# Patient Record
Sex: Male | Born: 1975 | Race: Black or African American | Hispanic: No | Marital: Single | Smoking: Current every day smoker
Health system: Southern US, Community
[De-identification: ages and names within clinical notes are randomized; demographics above are authoritative.]

## PROBLEM LIST (undated history)

## (undated) DIAGNOSIS — E119 Type 2 diabetes mellitus without complications: Secondary | ICD-10-CM

## (undated) DIAGNOSIS — F1910 Other psychoactive substance abuse, uncomplicated: Secondary | ICD-10-CM

## (undated) HISTORY — PX: BACK SURGERY: SHX140

## (undated) HISTORY — PX: OTHER SURGICAL HISTORY: SHX169

---

## 2004-09-26 ENCOUNTER — Emergency Department: Payer: Self-pay | Admitting: Emergency Medicine

## 2005-10-10 ENCOUNTER — Inpatient Hospital Stay: Payer: Self-pay | Admitting: General Surgery

## 2006-02-11 ENCOUNTER — Emergency Department: Payer: Self-pay | Admitting: Emergency Medicine

## 2006-06-14 ENCOUNTER — Emergency Department: Payer: Self-pay | Admitting: Emergency Medicine

## 2006-06-14 ENCOUNTER — Other Ambulatory Visit: Payer: Self-pay

## 2015-09-07 ENCOUNTER — Emergency Department
Admission: EM | Admit: 2015-09-07 | Discharge: 2015-09-07 | Disposition: A | Discharge status: Home / Self Care | Attending: Emergency Medicine | Admitting: Emergency Medicine

## 2015-09-07 ENCOUNTER — Emergency Department

## 2015-09-07 ENCOUNTER — Encounter: Payer: Self-pay | Admitting: Emergency Medicine

## 2015-09-07 DIAGNOSIS — S0231XA Fracture of orbital floor, right side, initial encounter for closed fracture: Secondary | ICD-10-CM | POA: Insufficient documentation

## 2015-09-07 DIAGNOSIS — S02839A Fracture of medial orbital wall, unspecified side, initial encounter for closed fracture: Secondary | ICD-10-CM

## 2015-09-07 DIAGNOSIS — S0990XA Unspecified injury of head, initial encounter: Secondary | ICD-10-CM | POA: Insufficient documentation

## 2015-09-07 DIAGNOSIS — F17210 Nicotine dependence, cigarettes, uncomplicated: Secondary | ICD-10-CM | POA: Insufficient documentation

## 2015-09-07 DIAGNOSIS — Y92148 Other place in prison as the place of occurrence of the external cause: Secondary | ICD-10-CM | POA: Insufficient documentation

## 2015-09-07 DIAGNOSIS — S40011A Contusion of right shoulder, initial encounter: Secondary | ICD-10-CM | POA: Insufficient documentation

## 2015-09-07 DIAGNOSIS — M7918 Myalgia, other site: Secondary | ICD-10-CM

## 2015-09-07 DIAGNOSIS — Y998 Other external cause status: Secondary | ICD-10-CM | POA: Insufficient documentation

## 2015-09-07 DIAGNOSIS — Y9389 Activity, other specified: Secondary | ICD-10-CM | POA: Insufficient documentation

## 2015-09-07 DIAGNOSIS — S022XXA Fracture of nasal bones, initial encounter for closed fracture: Secondary | ICD-10-CM | POA: Insufficient documentation

## 2015-09-07 DIAGNOSIS — S0083XA Contusion of other part of head, initial encounter: Secondary | ICD-10-CM | POA: Insufficient documentation

## 2015-09-07 MED ORDER — LORAZEPAM 2 MG/ML IJ SOLN
1.0000 mg | Freq: Once | INTRAMUSCULAR | Status: AC
  Administered 2015-09-07: 1 mg via INTRAMUSCULAR
  Filled 2015-09-07: qty 1

## 2015-09-07 MED ORDER — TRAMADOL HCL 50 MG PO TABS
50.0000 mg | ORAL_TABLET | Freq: Once | ORAL | Status: AC
  Administered 2015-09-07: 50 mg via ORAL
  Filled 2015-09-07: qty 1

## 2015-09-07 MED ORDER — AMOXICILLIN-POT CLAVULANATE 875-125 MG PO TABS: 1.0000 | ORAL_TABLET | Freq: Two times a day (BID) | ORAL | Status: AC

## 2015-09-07 MED ORDER — TRAMADOL HCL 50 MG PO TABS: 50.0000 mg | ORAL_TABLET | Freq: Four times a day (QID) | ORAL | Status: DC | PRN
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
## 2015-09-07 NOTE — Discharge Instructions (Signed)
Contusion A contusion is a deep bruise. Contusions are the result of a blunt injury to tissues and muscle fibers under the skin. The injury causes bleeding under the skin. The skin overlying the contusion may turn blue, purple, or yellow. Minor injuries will give you a painless contusion, but more severe contusions may stay painful and swollen for a few weeks.  CAUSES  This condition is usually caused by a blow, trauma, or direct force to an area of the body. SYMPTOMS  Symptoms of this condition include:  Swelling of the injured area.  Pain and tenderness in the injured area.  Discoloration. The area may have redness and then turn blue, purple, or yellow. DIAGNOSIS  This condition is diagnosed based on a physical exam and medical history. An X-ray, CT scan, or MRI may be needed to determine if there are any associated injuries, such as broken bones (fractures). TREATMENT  Specific treatment for this condition depends on what area of the body was injured. In general, the best treatment for a contusion is resting, icing, applying pressure to (compression), and elevating the injured area. This is often called the RICE strategy. Over-the-counter anti-inflammatory medicines may also be recommended for pain control.  HOME CARE INSTRUCTIONS   Rest the injured area.  If directed, apply ice to the injured area:  Put ice in a plastic bag.  Place a towel between your skin and the bag.  Leave the ice on for 20 minutes, 2-3 times per day.  If directed, apply light compression to the injured area using an elastic bandage. Make sure the bandage is not wrapped too tightly. Remove and reapply the bandage as directed by your health care provider.  If possible, raise (elevate) the injured area above the level of your heart while you are sitting or lying down.  Take over-the-counter and prescription medicines only as told by your health care provider. SEEK MEDICAL CARE IF:  Your symptoms do not  improve after several days of treatment.  Your symptoms get worse.  You have difficulty moving the injured area. SEEK IMMEDIATE MEDICAL CARE IF:   You have severe pain.  You have numbness in a hand or foot.  Your hand or foot turns pale or cold.   This information is not intended to replace advice given to you by your health care provider. Make sure you discuss any questions you have with your health care provider.   Document Released: 05/30/2005 Document Revised: 05/11/2015 Document Reviewed: 01/05/2015 Elsevier Interactive Patient Education 2016 [employer].  General Assault Assault includes any behavior or physical attack--whether it is on purpose or not--that results in injury to another person, damage to property, or both. This also includes assault that has not yet happened, but is planned to happen. Threats of assault may be physical, verbal, or written. They may be said or sent by:  Mail.  E-mail.  Text.  Social media.  Fax. The threats may be direct, implied, or understood. WHAT ARE THE DIFFERENT FORMS OF ASSAULT? Forms of assault include:  Physically assaulting a person. This includes physical threats to inflict physical harm as well as:  Slapping.  Hitting.  Poking.  Kicking.  Punching.  Pushing.  Sexually assaulting a person. Sexual assault is any sexual activity that a person is forced, threatened, or coerced to participate in. It may or may not involve physical contact with the person who is assaulting you. You are sexually assaulted if you are forced to have sexual contact of any kind.  Damaging or destroying a person's assistive equipment, such as glasses, canes, or walkers.  Throwing or hitting objects.  Using or displaying a weapon to harm or threaten someone.  Using or displaying an object that appears to be a weapon in a threatening manner.  Using greater physical size or strength to intimidate someone.  Making intimidating or  threatening gestures.  Bullying.  Hazing.  Using language that is intimidating, threatening, hostile, or abusive.  Stalking.  Restraining someone with force. WHAT SHOULD I DO IF I EXPERIENCE ASSAULT?  Report assaults, threats, and stalking to the police. Call your local emergency services (911 in the [place] if you are in immediate danger or you need medical help.  You can work with a Clinical research associate or an advocate to get legal protection against someone who has assaulted you or threatened you with assault. Protection includes restraining orders and private addresses. Crimes against you, such as assault, can also be prosecuted through the courts. Laws will vary depending on where you live.   This information is not intended to replace advice given to you by your health care provider. Make sure you discuss any questions you have with your health care provider.   Document Released: 08/20/2005 Document Revised: 09/10/2014 Document Reviewed: 05/07/2014 Elsevier Interactive Patient Education 2016 Elsevier Inc.  Musculoskeletal Pain Musculoskeletal pain is muscle and boney aches and pains. These pains can occur in any part of the body. Your caregiver may treat you without knowing the cause of the pain. They may treat you if blood or urine tests, X-rays, and other tests were normal.  CAUSES There is often not a definite cause or reason for these pains. These pains may be caused by a type of germ (virus). The discomfort may also come from overuse. Overuse includes working out too hard when your body is not fit. Boney aches also come from weather changes. Bone is sensitive to atmospheric pressure changes. HOME CARE INSTRUCTIONS   Ask when your test results will be ready. Make sure you get your test results.  Only take over-the-counter or prescription medicines for pain, discomfort, or fever as directed by your caregiver. If you were given medications for your condition, do not drive, operate machinery or  power tools, or sign legal documents for 24 hours. Do not drink alcohol. Do not take sleeping pills or other medications that may interfere with treatment.  Continue all activities unless the activities cause more pain. When the pain lessens, slowly resume normal activities. Gradually increase the intensity and duration of the activities or exercise.  During periods of severe pain, bed rest may be helpful. Lay or sit in any position that is comfortable.  Putting ice on the injured area.  Put ice in a bag.  Place a towel between your skin and the bag.  Leave the ice on for 15 to 20 minutes, 3 to 4 times a day.  Follow up with your caregiver for continued problems and no reason can be found for the pain. If the pain becomes worse or does not go away, it may be necessary to repeat tests or do additional testing. Your caregiver may need to look further for a possible cause. SEEK IMMEDIATE MEDICAL CARE IF:  You have pain that is getting worse and is not relieved by medications.  You develop chest pain that is associated with shortness or breath, sweating, feeling sick to your stomach (nauseous), or throw up (vomit).  Your pain becomes localized to the abdomen.  You develop any new  symptoms that seem different or that concern you. MAKE SURE YOU:   Understand these instructions.  Will watch your condition.  Will get help right away if you are not doing well or get worse.   This information is not intended to replace advice given to you by your health care provider. Make sure you discuss any questions you have with your health care provider.   Document Released: 08/20/2005 Document Revised: 11/12/2011 Document Reviewed: 04/24/2013 Elsevier Interactive Patient Education 2016 Elsevier Inc.  Nasal Fracture A nasal fracture is a break or crack in the bones or cartilage of the nose. Minor breaks do not require treatment. These breaks usually heal on their own after about one month. Serious  breaks may require surgery. CAUSES This injury is usually caused by a blunt injury to the nose. This type of injury often occurs from:  Contact sports.  Car accidents.  Falls.  Getting punched. SYMPTOMS Symptoms of this injury include:  Pain.  Swelling of the nose.  Bleeding from the nose.  Bruising around the nose or eyes. This may include having black eyes.  Crooked appearance of the nose. DIAGNOSIS This injury may be diagnosed with a physical exam. The health care provider will gently feel the nose for signs of broken bones. He or she will look inside the nostrils to make sure that there is not a blood-filled swelling on the dividing wall between the nostrils (septal hematoma). X-rays of the nose may not show a nasal fracture even when one is present. In some cases, X-rays or a CT scan may be done 1-5 days after the injury. Sometimes, the health care provider will want to wait until the swelling has gone down. TREATMENT Often, minor fractures that have caused no deformity do not require treatment. More serious fractures in which bones have moved out of position may require surgery, which will take place after the swelling is gone. Surgery will stabilize and align the fracture. In some cases, a health care provider may be able to reposition the bones without surgery. This may be done in the health care provider's office after medicine is given to numb the area (local anesthetic). HOME CARE INSTRUCTIONS  If directed, apply ice to the injured area:  Put ice in a plastic bag.  Place a towel between your skin and the bag.  Leave the ice on for 20 minutes, 2-3 times per day.  Take over-the-counter and prescription medicines only as told by your health care provider.  If your nose starts to bleed, sit in an upright position while you squeeze the soft parts of your nose against the dividing wall between your nostrils (septum) for 10 minutes.  Try to avoid blowing your  nose.  Return to your normal activities as told by your health care provider. Ask your health care provider what activities are safe for you.  Avoid contact sports for 3-4 weeks or as told by your health care provider.  Keep all follow-up visits as told by your health care provider. This is important. SEEK MEDICAL CARE IF:  Your pain increases or becomes severe.  You continue to have nosebleeds.  The shape of your nose does not return to normal within 5 days.  You have pus draining out of your nose. SEEK IMMEDIATE MEDICAL CARE IF:  You have bleeding from your nose that does not stop after you pinch your nostrils closed for 20 minutes and keep ice on your nose.  You have clear fluid draining out of your  nose.  You notice a grape-like swelling on the septum. This swelling is a collection of blood (hematoma) that must be drained to help prevent infection.  You have difficulty moving your eyes.  You have repeated vomiting.   This information is not intended to replace advice given to you by your health care provider. Make sure you discuss any questions you have with your health care provider.   Document Released: 08/17/2000 Document Revised: 05/11/2015 Document Reviewed: 09/27/2014 Elsevier Interactive Patient Education [employer] Elsevier Inc.

## 2015-09-07 NOTE — ED Notes (Signed)
Pt peed himself. Bed linen was changed.  
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
## 2016-04-09 ENCOUNTER — Inpatient Hospital Stay
Admission: EM | Admit: 2016-04-09 | Discharge: 2016-04-11 | DRG: 897 | Disposition: A | Discharge status: Home / Self Care | Payer: Self-pay | Attending: Internal Medicine | Admitting: Internal Medicine

## 2016-04-09 ENCOUNTER — Emergency Department: Payer: Self-pay

## 2016-04-09 ENCOUNTER — Encounter: Payer: Self-pay | Admitting: Emergency Medicine

## 2016-04-09 DIAGNOSIS — I951 Orthostatic hypotension: Secondary | ICD-10-CM | POA: Diagnosis present

## 2016-04-09 DIAGNOSIS — F1910 Other psychoactive substance abuse, uncomplicated: Secondary | ICD-10-CM

## 2016-04-09 DIAGNOSIS — D72829 Elevated white blood cell count, unspecified: Secondary | ICD-10-CM | POA: Diagnosis present

## 2016-04-09 DIAGNOSIS — R4182 Altered mental status, unspecified: Secondary | ICD-10-CM

## 2016-04-09 DIAGNOSIS — Z716 Tobacco abuse counseling: Secondary | ICD-10-CM

## 2016-04-09 DIAGNOSIS — F19121 Other psychoactive substance abuse with intoxication delirium: Principal | ICD-10-CM | POA: Diagnosis present

## 2016-04-09 DIAGNOSIS — E876 Hypokalemia: Secondary | ICD-10-CM | POA: Diagnosis present

## 2016-04-09 DIAGNOSIS — I248 Other forms of acute ischemic heart disease: Secondary | ICD-10-CM | POA: Diagnosis present

## 2016-04-09 DIAGNOSIS — M6282 Rhabdomyolysis: Secondary | ICD-10-CM | POA: Diagnosis present

## 2016-04-09 DIAGNOSIS — R778 Other specified abnormalities of plasma proteins: Secondary | ICD-10-CM

## 2016-04-09 DIAGNOSIS — F1210 Cannabis abuse, uncomplicated: Secondary | ICD-10-CM

## 2016-04-09 DIAGNOSIS — E871 Hypo-osmolality and hyponatremia: Secondary | ICD-10-CM | POA: Diagnosis present

## 2016-04-09 DIAGNOSIS — J441 Chronic obstructive pulmonary disease with (acute) exacerbation: Secondary | ICD-10-CM | POA: Diagnosis present

## 2016-04-09 DIAGNOSIS — R7989 Other specified abnormal findings of blood chemistry: Secondary | ICD-10-CM

## 2016-04-09 DIAGNOSIS — R109 Unspecified abdominal pain: Secondary | ICD-10-CM

## 2016-04-09 DIAGNOSIS — F12121 Cannabis abuse with intoxication delirium: Secondary | ICD-10-CM | POA: Diagnosis present

## 2016-04-09 DIAGNOSIS — F10188 Alcohol abuse with other alcohol-induced disorder: Secondary | ICD-10-CM | POA: Diagnosis present

## 2016-04-09 DIAGNOSIS — E860 Dehydration: Secondary | ICD-10-CM | POA: Diagnosis present

## 2016-04-09 DIAGNOSIS — F19921 Other psychoactive substance use, unspecified with intoxication with delirium: Secondary | ICD-10-CM

## 2016-04-09 DIAGNOSIS — G255 Other chorea: Secondary | ICD-10-CM

## 2016-04-09 DIAGNOSIS — F17210 Nicotine dependence, cigarettes, uncomplicated: Secondary | ICD-10-CM | POA: Diagnosis present

## 2016-04-09 DIAGNOSIS — F1410 Cocaine abuse, uncomplicated: Secondary | ICD-10-CM | POA: Diagnosis present

## 2016-04-09 DIAGNOSIS — R000 Tachycardia, unspecified: Secondary | ICD-10-CM

## 2016-04-09 HISTORY — DX: Other psychoactive substance abuse, uncomplicated: F19.10

## 2016-04-09 LAB — CBC
HCT: 45.3 % (ref 40.0–52.0)
HEMOGLOBIN: 15.6 g/dL (ref 13.0–18.0)
MCH: 30 pg (ref 26.0–34.0)
MCHC: 34.3 g/dL (ref 32.0–36.0)
MCV: 87.4 fL (ref 80.0–100.0)
PLATELETS: 200 10*3/uL (ref 150–440)
RBC: 5.19 MIL/uL (ref 4.40–5.90)
RDW: 13.3 % (ref 11.5–14.5)
WBC: 24.9 10*3/uL — AB (ref 3.8–10.6)

## 2016-04-09 LAB — COMPREHENSIVE METABOLIC PANEL
ALBUMIN: 3.6 g/dL (ref 3.5–5.0)
ALK PHOS: 101 U/L (ref 38–126)
ALT: 145 U/L — ABNORMAL HIGH (ref 17–63)
ALT: 121 U/L — ABNORMAL HIGH (ref 17–63)
ANION GAP: 15 (ref 5–15)
ANION GAP: 14 (ref 5–15)
AST: 369 U/L — ABNORMAL HIGH (ref 15–41)
AST: 241 U/L — ABNORMAL HIGH (ref 15–41)
Albumin: 4.4 g/dL (ref 3.5–5.0)
Alkaline Phosphatase: 80 U/L (ref 38–126)
BILIRUBIN TOTAL: 1.7 mg/dL — AB (ref 0.3–1.2)
BUN: 20 mg/dL (ref 6–20)
BUN: 19 mg/dL (ref 6–20)
CALCIUM: 9.7 mg/dL (ref 8.9–10.3)
CHLORIDE: 101 mmol/L (ref 101–111)
CO2: 23 mmol/L (ref 22–32)
CO2: 21 mmol/L — ABNORMAL LOW (ref 22–32)
Calcium: 8.6 mg/dL — ABNORMAL LOW (ref 8.9–10.3)
Chloride: 95 mmol/L — ABNORMAL LOW (ref 101–111)
Creatinine, Ser: 1.39 mg/dL — ABNORMAL HIGH (ref 0.61–1.24)
Creatinine, Ser: 1.13 mg/dL (ref 0.61–1.24)
GFR calc Af Amer: >60 mL/min (ref >60)
GFR calc non Af Amer: >60 mL/min (ref >60)
GFR calc non Af Amer: >60 mL/min (ref >60)
GLUCOSE: 100 mg/dL — AB (ref 65–99)
Glucose, Bld: 96 mg/dL (ref 65–99)
POTASSIUM: 4.2 mmol/L (ref 3.5–5.1)
POTASSIUM: 3.8 mmol/L (ref 3.5–5.1)
SODIUM: 133 mmol/L — AB (ref 135–145)
SODIUM: 136 mmol/L (ref 135–145)
TOTAL PROTEIN: 8.4 g/dL — AB (ref 6.5–8.1)
TOTAL PROTEIN: 7 g/dL (ref 6.5–8.1)
Total Bilirubin: 2.2 mg/dL — ABNORMAL HIGH (ref 0.3–1.2)

## 2016-04-09 LAB — URINE DRUG SCREEN, QUALITATIVE (ARMC ONLY)
AMPHETAMINES, UR SCREEN: NOT DETECTED
Barbiturates, Ur Screen: NOT DETECTED
Benzodiazepine, Ur Scrn: NOT DETECTED
Cannabinoid 50 Ng, Ur ~~LOC~~: POSITIVE — AB
Cocaine Metabolite,Ur ~~LOC~~: POSITIVE — AB
MDMA (ECSTASY) UR SCREEN: NOT DETECTED
Methadone Scn, Ur: NOT DETECTED
Opiate, Ur Screen: NOT DETECTED
Phencyclidine (PCP) Ur S: NOT DETECTED
TRICYCLIC, UR SCREEN: NOT DETECTED

## 2016-04-09 LAB — PROTIME-INR
INR: 1.02
PROTHROMBIN TIME: 13.4 s (ref 11.4–15.2)

## 2016-04-09 LAB — URINALYSIS COMPLETE WITH MICROSCOPIC (ARMC ONLY)
BILIRUBIN URINE: NEGATIVE
GLUCOSE, UA: NEGATIVE mg/dL
LEUKOCYTES UA: NEGATIVE
Nitrite: NEGATIVE
Protein, ur: 100 mg/dL — AB
SPECIFIC GRAVITY, URINE: 1.023 (ref 1.005–1.030)
pH: 5 (ref 5.0–8.0)

## 2016-04-09 LAB — ETHANOL: Alcohol, Ethyl (B): <5 mg/dL (ref <5)

## 2016-04-09 LAB — ACETAMINOPHEN LEVEL

## 2016-04-09 LAB — SALICYLATE LEVEL

## 2016-04-09 LAB — LACTIC ACID, PLASMA: LACTIC ACID, VENOUS: 1.1 mmol/L (ref 0.5–1.9)

## 2016-04-09 LAB — AMMONIA: AMMONIA: 30 umol/L (ref 9–35)

## 2016-04-09 LAB — CK: Total CK: 9493 U/L — ABNORMAL HIGH (ref 49–397)

## 2016-04-09 MED ORDER — SODIUM CHLORIDE 0.9 % IV BOLUS (SEPSIS)
1000.0000 mL | Freq: Once | INTRAVENOUS | Status: AC
  Administered 2016-04-09: 1000 mL via INTRAVENOUS

## 2016-04-09 MED ORDER — LORAZEPAM 2 MG/ML IJ SOLN: 2.0000 mg | INTRAMUSCULAR | Status: AC

## 2016-04-09 MED ORDER — LORAZEPAM 2 MG/ML IJ SOLN
0.5000 mg | INTRAMUSCULAR | Status: AC
  Administered 2016-04-09: 0.5 mg via INTRAVENOUS
  Filled 2016-04-09: qty 1

## 2016-04-09 NOTE — ED Notes (Addendum)
EDP verbalized clear liquids for now as patient is becoming more alert. Advance as tolerated by MD approval tomorrow.

## 2016-04-09 NOTE — ED Notes (Signed)
BEHAVIORAL HEALTH ROUNDING Patient sleeping: Yes.   Patient alert and oriented: yes Behavior appropriate: Yes.  ; If no, describe:  Nutrition and fluids offered: Yes  Toileting and hygiene offered: Yes  Sitter present: not applicable Law enforcement present: Yes  
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
## 2016-04-09 NOTE — ED Notes (Signed)
Patient refused v-signs, and talking to himself., notified.

## 2016-04-09 NOTE — ED Notes (Signed)
BEHAVIORAL HEALTH ROUNDING Patient sleeping: No. Patient alert and oriented: yes Behavior appropriate: No.; If no, describe: jumpy alternating with somnulent Nutrition and fluids offered: Yes  Toileting and hygiene offered: Yes  Sitter present: not applicable Law enforcement present: Yes

## 2016-04-09 NOTE — ED Notes (Signed)
EDP at bedside, pt.reports he is "just chiiling here at the hospital, needs to quit doing drugs"
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
## 2016-04-09 NOTE — ED Notes (Signed)
Pt. Given urinal to attempt to provide a urine sample. Pt. Requesting food, awaiting EDP approval due to pt. Being lethargic.

## 2016-04-09 NOTE — ED Notes (Signed)

## 2016-04-09 NOTE — ED Notes (Signed)
Pt becoming more coherent but very jumpy and CT unable to do scan. Med with ativan IV. Pt calms. US done in room, then patient to CT scan. Resp regular and SAT WNL after ativan.

## 2016-04-10 ENCOUNTER — Emergency Department: Payer: Self-pay

## 2016-04-10 ENCOUNTER — Encounter: Payer: Self-pay | Admitting: Internal Medicine

## 2016-04-10 ENCOUNTER — Inpatient Hospital Stay
Admit: 2016-04-10 | Discharge: 2016-04-10 | Disposition: A | Discharge status: Home / Self Care | Payer: Self-pay | Attending: Internal Medicine | Admitting: Internal Medicine

## 2016-04-10 DIAGNOSIS — F19921 Other psychoactive substance use, unspecified with intoxication with delirium: Secondary | ICD-10-CM

## 2016-04-10 DIAGNOSIS — R4182 Altered mental status, unspecified: Secondary | ICD-10-CM | POA: Diagnosis present

## 2016-04-10 DIAGNOSIS — M6282 Rhabdomyolysis: Secondary | ICD-10-CM | POA: Diagnosis present

## 2016-04-10 LAB — CBC
HEMATOCRIT: 38.5 % — AB (ref 40.0–52.0)
HEMOGLOBIN: 13.2 g/dL (ref 13.0–18.0)
MCH: 30.1 pg (ref 26.0–34.0)
MCHC: 34.3 g/dL (ref 32.0–36.0)
MCV: 87.6 fL (ref 80.0–100.0)
Platelets: 146 10*3/uL — ABNORMAL LOW (ref 150–440)
RBC: 4.39 MIL/uL — AB (ref 4.40–5.90)
RDW: 13.3 % (ref 11.5–14.5)
WBC: 13.9 10*3/uL — ABNORMAL HIGH (ref 3.8–10.6)

## 2016-04-10 LAB — BASIC METABOLIC PANEL
Anion gap: 7 (ref 5–15)
BUN: 16 mg/dL (ref 6–20)
CHLORIDE: 102 mmol/L (ref 101–111)
CO2: 26 mmol/L (ref 22–32)
CREATININE: 1.1 mg/dL (ref 0.61–1.24)
Calcium: 8 mg/dL — ABNORMAL LOW (ref 8.9–10.3)
GFR calc Af Amer: >60 mL/min (ref >60)
GFR calc non Af Amer: >60 mL/min (ref >60)
Glucose, Bld: 114 mg/dL — ABNORMAL HIGH (ref 65–99)
POTASSIUM: 3.2 mmol/L — AB (ref 3.5–5.1)
SODIUM: 135 mmol/L (ref 135–145)

## 2016-04-10 LAB — TROPONIN I
Troponin I: 0.05 ng/mL (ref <0.03)
Troponin I: <0.03 ng/mL (ref <0.03)

## 2016-04-10 LAB — CK
CK TOTAL: 9300 U/L — AB (ref 49–397)
Total CK: 7086 U/L — ABNORMAL HIGH (ref 49–397)

## 2016-04-10 LAB — MRSA PCR SCREENING: MRSA BY PCR: NEGATIVE

## 2016-04-10 MED ORDER — IPRATROPIUM-ALBUTEROL 0.5-2.5 (3) MG/3ML IN SOLN
3.0000 mL | RESPIRATORY_TRACT | Status: DC
  Administered 2016-04-10 (×2): 3 mL via RESPIRATORY_TRACT
  Filled 2016-04-10 (×2): qty 3

## 2016-04-10 MED ORDER — LEVOFLOXACIN IN D5W 750 MG/150ML IV SOLN: 750.0000 mg | INTRAVENOUS | Status: DC

## 2016-04-10 MED ORDER — SODIUM CHLORIDE 0.9% FLUSH
3.0000 mL | Freq: Two times a day (BID) | INTRAVENOUS | Status: DC
  Administered 2016-04-10 – 2016-04-11 (×4): 3 mL via INTRAVENOUS

## 2016-04-10 MED ORDER — SODIUM CHLORIDE 0.9 % IV SOLN
INTRAVENOUS | Status: DC
  Administered 2016-04-10 (×2): via INTRAVENOUS

## 2016-04-10 MED ORDER — VITAMIN B-1 100 MG PO TABS
100.0000 mg | ORAL_TABLET | Freq: Every day | ORAL | Status: DC
  Administered 2016-04-10 – 2016-04-11 (×2): 100 mg via ORAL
  Filled 2016-04-10 (×2): qty 1

## 2016-04-10 MED ORDER — ACETAMINOPHEN 325 MG PO TABS
650.0000 mg | ORAL_TABLET | Freq: Four times a day (QID) | ORAL | Status: DC | PRN
  Administered 2016-04-10: 650 mg via ORAL
  Filled 2016-04-10: qty 2

## 2016-04-10 MED ORDER — SODIUM CHLORIDE 0.9 % IV BOLUS (SEPSIS)
1000.0000 mL | Freq: Once | INTRAVENOUS | Status: AC
  Administered 2016-04-10: 1000 mL via INTRAVENOUS

## 2016-04-10 MED ORDER — ENOXAPARIN SODIUM 40 MG/0.4ML ~~LOC~~ SOLN
40.0000 mg | SUBCUTANEOUS | Status: DC
  Administered 2016-04-10: 40 mg via SUBCUTANEOUS
  Filled 2016-04-10: qty 0.4

## 2016-04-10 MED ORDER — ONDANSETRON HCL 4 MG PO TABS: 4.0000 mg | ORAL_TABLET | Freq: Four times a day (QID) | ORAL | Status: DC | PRN

## 2016-04-10 MED ORDER — SENNOSIDES-DOCUSATE SODIUM 8.6-50 MG PO TABS
1.0000 | ORAL_TABLET | Freq: Every evening | ORAL | Status: DC | PRN
Start: 2016-04-10 — End: 2016-04-11

## 2016-04-10 MED ORDER — LEVOFLOXACIN IN D5W 750 MG/150ML IV SOLN
750.0000 mg | Freq: Once | INTRAVENOUS | Status: AC
Start: 2016-04-10 — End: 2016-04-10
  Administered 2016-04-10: 750 mg via INTRAVENOUS
  Filled 2016-04-10: qty 150

## 2016-04-10 MED ORDER — POTASSIUM CHLORIDE IN NACL 20-0.9 MEQ/L-% IV SOLN
INTRAVENOUS | Status: DC
  Administered 2016-04-10 – 2016-04-11 (×3): via INTRAVENOUS
  Filled 2016-04-10 (×5): qty 1000

## 2016-04-10 MED ORDER — LORAZEPAM 2 MG/ML IJ SOLN: 2.0000 mg | INTRAMUSCULAR | Status: DC | PRN

## 2016-04-10 MED ORDER — CLINDAMYCIN PHOSPHATE 900 MG/50ML IV SOLN
900.0000 mg | Freq: Three times a day (TID) | INTRAVENOUS | Status: DC
  Administered 2016-04-10 – 2016-04-11 (×4): 900 mg via INTRAVENOUS
  Filled 2016-04-10 (×6): qty 50

## 2016-04-10 MED ORDER — ONDANSETRON HCL 4 MG/2ML IJ SOLN
4.0000 mg | Freq: Four times a day (QID) | INTRAMUSCULAR | Status: DC | PRN
  Filled 2016-04-10: qty 2

## 2016-04-10 MED ORDER — FOLIC ACID 1 MG PO TABS
1.0000 mg | ORAL_TABLET | Freq: Every day | ORAL | Status: DC
  Administered 2016-04-10 – 2016-04-11 (×2): 1 mg via ORAL
  Filled 2016-04-10 (×2): qty 1

## 2016-04-10 MED ORDER — ACETAMINOPHEN 650 MG RE SUPP: 650.0000 mg | Freq: Four times a day (QID) | RECTAL | Status: DC | PRN

## 2016-04-10 MED ORDER — NICOTINE 21 MG/24HR TD PT24
21.0000 mg | MEDICATED_PATCH | Freq: Every day | TRANSDERMAL | Status: DC
  Administered 2016-04-10 – 2016-04-11 (×2): 21 mg via TRANSDERMAL
  Filled 2016-04-10 (×2): qty 1

## 2016-04-10 NOTE — ED Notes (Signed)
Admitting MD at bedside.
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
## 2016-04-10 NOTE — Care Management (Signed)
Patient presents with altered mental status.  He is positive for cocaine.  Very restless this morning and concerns that patient will decline further as he goes through withdrawal.  CK found to be > 7000. Unable to assess patient today due to his increasing agitation

## 2016-04-10 NOTE — Plan of Care (Signed)
Dr. Text to inform of critical lab: Troponin 0.05
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
## 2016-04-10 NOTE — ED Notes (Signed)
Patient transported to X-ray 

## 2016-04-10 NOTE — Plan of Care (Signed)
Poison control called and was given status update on patient.  Please call [phone] with any additional concerns or questions.

## 2016-04-10 NOTE — Consult Note (Signed)
[facility] Consult   Reason for Consult:  Consult for this 40 year old man with out any past psychiatric history came into the emergency room delirious Referring Physician:  Ether Griffins Patient Identification: Cody Rojas MRN:  [id number] Principal Diagnosis: Psychoactive substance-induced organic delirium [facility] Diagnosis:   Patient Active Problem List   Diagnosis Date Noted  . Altered mental status [R41.82] 04/10/2016  . Rhabdomyolysis [M62.82] 04/10/2016  . Psychoactive substance-induced organic delirium [facility]) [F19.921] 04/10/2016    Total Time spent with patient: 1 hour  Subjective:   Cody Rojas is a 40 y.o. male patient admitted with "I guess it was just the drugs".  HPI:  Patient interviewed. Chart reviewed. I saw him briefly in the emergency room yesterday as well. This 40 year old man was brought in by police who found him passed out somewhere in public very confused. In the emergency room yesterday the patient was confused and delirious. The only thing he could say to triage was that he had taken too much "Molly". On interview today the patient says that the last thing he remembers is that he had had several days of binging on multiple drugs last week. He says that he had been getting high with cocaine, ecstasy, marijuana and a little bit of alcohol for several days straight. He finally decided to stop but after stopping his use his delirium actually progressed and got worse for a few days. He remembers that he was running through the streets of town confused and hallucinating for over a day before he passed out and the police brought him in. Patient denies having had any mood symptoms. He denies any past psychiatric history. Denies a prescription medicine. Today he is feeling much better. Has not had any hallucinations today. Feeling much more oriented.  Social history: Patient reports that he has a safe and secure place to live. Stays by himself. Not working  currently.  Medical history: Denies any medical problems.  Substance abuse history: Reports a long history of substance abuse with multiple drugs although this is apparently the first time that he is binge this hard on designer drugs. He says he's never made any serious attempt to stop and hasn't seen himself as having a substance abuse problem.  Past Psychiatric History: He reports that he saw a psychiatrist when he was in prison for vague symptoms of depression but never saw anybody for mental health treatment outside the hospital. No history of psychiatric hospitalization. No history of suicide attempts. Never prescribed any psychiatric medicine. Never involved in any kind of substance abuse treatment.  Risk to Self: Is patient at risk for suicide?: No Risk to Others:   Prior Inpatient Therapy:   Prior Outpatient Therapy:    Past Medical History:  Past Medical History:  Diagnosis Date  . Substance abuse     Past Surgical History:  Procedure Laterality Date  . BACK SURGERY     Family History:  Family History  Problem Relation Age of Onset  . Family history unknown: Yes   Family Psychiatric  History: Patient says he knows of no family history of mental health or substance abuse problems Social History:  History  Alcohol Use  . Yes     History  Drug Use  . Types: Cocaine, Marijuana, MDMA (Ecstacy)    Social History   Social History  . Marital status: Single    Spouse name: N/A  . Number of children: N/A  . Years of education: N/A   Occupational History  .  unemployed    Social History Main Topics  . Smoking status: Current Every Day Smoker    Types: Cigarettes  . Smokeless tobacco: Never Used  . Alcohol use Yes  . Drug use:     Types: Cocaine, Marijuana, MDMA (Ecstacy)  . Sexual activity: Not Asked   Other Topics Concern  . None   Social History Narrative  . None   Additional Social History:    Allergies:  No Known Allergies  Labs:  Results for  orders placed or performed during the hospital encounter of 04/09/16 (from the past 48 hour(s))  Comprehensive metabolic panel     Status: Abnormal   Collection Time: 04/09/16  1:49 PM  Result Value Ref Range   Sodium 133 (L) 135 - 145 mmol/L   Potassium 4.2 3.5 - 5.1 mmol/L   Chloride 95 (L) 101 - 111 mmol/L   CO2 23 22 - 32 mmol/L   Glucose, Bld 96 65 - 99 mg/dL   BUN 20 6 - 20 mg/dL   Creatinine, Ser 1.39 (H) 0.61 - 1.24 mg/dL   Calcium 9.7 8.9 - 10.3 mg/dL   Total Protein 8.4 (H) 6.5 - 8.1 g/dL   Albumin 4.4 3.5 - 5.0 g/dL   AST 369 (H) 15 - 41 U/L    Comment: RESULT CONFIRMED BY MANUAL DILUTION   ALT 145 (H) 17 - 63 U/L   Alkaline Phosphatase 101 38 - 126 U/L   Total Bilirubin 2.2 (H) 0.3 - 1.2 mg/dL   GFR calc non Af Amer >60 >60 mL/min   GFR calc Af Amer >60 >60 mL/min    Comment: (NOTE) The eGFR has been calculated using the CKD EPI equation. This calculation has not been validated in all clinical situations. eGFR's persistently <60 mL/min signify possible Chronic Kidney Disease.    Anion gap 15 5 - 15  Ethanol     Status: None   Collection Time: 04/09/16  1:49 PM  Result Value Ref Range   Alcohol, Ethyl (B) <5 <5 mg/dL    Comment:        LOWEST DETECTABLE LIMIT FOR SERUM ALCOHOL IS 5 mg/dL FOR MEDICAL PURPOSES ONLY   cbc     Status: Abnormal   Collection Time: 04/09/16  1:49 PM  Result Value Ref Range   WBC 24.9 (H) 3.8 - 10.6 K/uL   RBC 5.19 4.40 - 5.90 MIL/uL   Hemoglobin 15.6 13.0 - 18.0 g/dL   HCT 45.3 40.0 - 52.0 %   MCV 87.4 80.0 - 100.0 fL   MCH 30.0 26.0 - 34.0 pg   MCHC 34.3 32.0 - 36.0 g/dL   RDW 13.3 11.5 - 14.5 %   Platelets 200 150 - 440 K/uL  Acetaminophen level     Status: Abnormal   Collection Time: 04/09/16  1:49 PM  Result Value Ref Range   Acetaminophen (Tylenol), Serum <10 (L) 10 - 30 ug/mL    Comment:        THERAPEUTIC CONCENTRATIONS VARY SIGNIFICANTLY. A RANGE OF 10-30 ug/mL MAY BE AN EFFECTIVE CONCENTRATION FOR MANY  PATIENTS. HOWEVER, SOME ARE BEST TREATED AT CONCENTRATIONS OUTSIDE THIS RANGE. ACETAMINOPHEN CONCENTRATIONS >150 ug/mL AT 4 HOURS AFTER INGESTION AND >50 ug/mL AT 12 HOURS AFTER INGESTION ARE OFTEN ASSOCIATED WITH TOXIC REACTIONS.   Salicylate level     Status: None   Collection Time: 04/09/16  1:49 PM  Result Value Ref Range   Salicylate Lvl <8.7 2.8 - 30.0 mg/dL  Protime-INR  Status: None   Collection Time: 04/09/16  1:49 PM  Result Value Ref Range   Prothrombin Time 13.4 11.4 - 15.2 seconds   INR 1.02   CK     Status: Abnormal   Collection Time: 04/09/16  1:49 PM  Result Value Ref Range   Total CK 9,493 (H) 49 - 397 U/L    Comment: RESULT CONFIRMED BY MANUAL DILUTION  Ammonia     Status: None   Collection Time: 04/09/16  3:40 PM  Result Value Ref Range   Ammonia 30 9 - 35 umol/L  Lactic acid, plasma     Status: None   Collection Time: 04/09/16  3:40 PM  Result Value Ref Range   Lactic Acid, Venous 1.1 0.5 - 1.9 mmol/L  Urine Drug Screen, Qualitative     Status: Abnormal   Collection Time: 04/09/16  8:44 PM  Result Value Ref Range   Tricyclic, Ur Screen NONE DETECTED NONE DETECTED   Amphetamines, Ur Screen NONE DETECTED NONE DETECTED   MDMA (Ecstasy)Ur Screen NONE DETECTED NONE DETECTED   Cocaine Metabolite,Ur [place] POSITIVE (A) NONE DETECTED   Opiate, Ur Screen NONE DETECTED NONE DETECTED   Phencyclidine (PCP) Ur S NONE DETECTED NONE DETECTED   Cannabinoid 50 Ng, Ur [place] POSITIVE (A) NONE DETECTED   Barbiturates, Ur Screen NONE DETECTED NONE DETECTED   Benzodiazepine, Ur Scrn NONE DETECTED NONE DETECTED   Methadone Scn, Ur NONE DETECTED NONE DETECTED    Comment: (NOTE) 416  Tricyclics, urine               Cutoff 1000 ng/mL 200  Amphetamines, urine             Cutoff 1000 ng/mL 300  MDMA (Ecstasy), urine           Cutoff 500 ng/mL 400  Cocaine Metabolite, urine       Cutoff 300 ng/mL 500  Opiate, urine                   Cutoff 300 ng/mL 600  Phencyclidine (PCP),  urine      Cutoff 25 ng/mL 700  Cannabinoid, urine              Cutoff 50 ng/mL 800  Barbiturates, urine             Cutoff 200 ng/mL 900  Benzodiazepine, urine           Cutoff 200 ng/mL 1000 Methadone, urine                Cutoff 300 ng/mL 1100 1200 The urine drug screen provides only a preliminary, unconfirmed 1300 analytical test result and should not be used for non-medical 1400 purposes. Clinical consideration and professional judgment should 1500 be applied to any positive drug screen result due to possible 1600 interfering substances. A more specific alternate chemical method 1700 must be used in order to obtain a confirmed analytical result.  1800 Gas chromato graphy / mass spectrometry (GC/MS) is the preferred 1900 confirmatory method.   Urinalysis complete, with microscopic ([facility] only)     Status: Abnormal   Collection Time: 04/09/16  8:44 PM  Result Value Ref Range   Color, Urine YELLOW (A) YELLOW   APPearance CLEAR (A) CLEAR   Glucose, UA NEGATIVE NEGATIVE mg/dL   Bilirubin Urine NEGATIVE NEGATIVE   Ketones, ur 1+ (A) NEGATIVE mg/dL   Specific Gravity, Urine 1.023 1.005 - 1.030   Hgb urine dipstick 3+ (A) NEGATIVE   pH  5.0 5.0 - 8.0   Protein, ur 100 (A) NEGATIVE mg/dL   Nitrite NEGATIVE NEGATIVE   Leukocytes, UA NEGATIVE NEGATIVE   RBC / HPF 0-5 0 - 5 RBC/hpf   WBC, UA 0-5 0 - 5 WBC/hpf   Bacteria, UA RARE (A) NONE SEEN   Squamous Epithelial / LPF 0-5 (A) NONE SEEN   Mucous PRESENT    Hyaline Casts, UA PRESENT   CK     Status: Abnormal   Collection Time: 04/09/16 10:50 PM  Result Value Ref Range   Total CK 9,300 (H) 49 - 397 U/L    Comment: RESULTS CONFIRMED BY MANUAL DILUTION  Comprehensive metabolic panel     Status: Abnormal   Collection Time: 04/09/16 10:50 PM  Result Value Ref Range   Sodium 136 135 - 145 mmol/L   Potassium 3.8 3.5 - 5.1 mmol/L   Chloride 101 101 - 111 mmol/L   CO2 21 (L) 22 - 32 mmol/L   Glucose, Bld 100 (H) 65 - 99 mg/dL   BUN 19  6 - 20 mg/dL   Creatinine, Ser 1.13 0.61 - 1.24 mg/dL   Calcium 8.6 (L) 8.9 - 10.3 mg/dL   Total Protein 7.0 6.5 - 8.1 g/dL   Albumin 3.6 3.5 - 5.0 g/dL   AST 241 (H) 15 - 41 U/L   ALT 121 (H) 17 - 63 U/L   Alkaline Phosphatase 80 38 - 126 U/L   Total Bilirubin 1.7 (H) 0.3 - 1.2 mg/dL   GFR calc non Af Amer >60 >60 mL/min   GFR calc Af Amer >60 >60 mL/min    Comment: (NOTE) The eGFR has been calculated using the CKD EPI equation. This calculation has not been validated in all clinical situations. eGFR's persistently <60 mL/min signify possible Chronic Kidney Disease.    Anion gap 14 5 - 15  CK     Status: Abnormal   Collection Time: 04/10/16  3:18 AM  Result Value Ref Range   Total CK 7,086 (H) 49 - 397 U/L    Comment: RESULTS CONFIRMED BY MANUAL DILUTION  CBC     Status: Abnormal   Collection Time: 04/10/16  3:18 AM  Result Value Ref Range   WBC 13.9 (H) 3.8 - 10.6 K/uL   RBC 4.39 (L) 4.40 - 5.90 MIL/uL   Hemoglobin 13.2 13.0 - 18.0 g/dL   HCT 38.5 (L) 40.0 - 52.0 %   MCV 87.6 80.0 - 100.0 fL   MCH 30.1 26.0 - 34.0 pg   MCHC 34.3 32.0 - 36.0 g/dL   RDW 13.3 11.5 - 14.5 %   Platelets 146 (L) 150 - 440 K/uL  Basic metabolic panel     Status: Abnormal   Collection Time: 04/10/16  3:18 AM  Result Value Ref Range   Sodium 135 135 - 145 mmol/L   Potassium 3.2 (L) 3.5 - 5.1 mmol/L   Chloride 102 101 - 111 mmol/L   CO2 26 22 - 32 mmol/L   Glucose, Bld 114 (H) 65 - 99 mg/dL   BUN 16 6 - 20 mg/dL   Creatinine, Ser 1.10 0.61 - 1.24 mg/dL   Calcium 8.0 (L) 8.9 - 10.3 mg/dL   GFR calc non Af Amer >60 >60 mL/min   GFR calc Af Amer >60 >60 mL/min    Comment: (NOTE) The eGFR has been calculated using the CKD EPI equation. This calculation has not been validated in all clinical situations. eGFR's persistently <60 mL/min signify possible Chronic Kidney  Disease.    Anion gap 7 5 - 15  Troponin I     Status: None   Collection Time: 04/10/16  3:18 AM  Result Value Ref Range    Troponin I <0.03 <0.03 ng/mL  MRSA PCR Screening     Status: None   Collection Time: 04/10/16  4:03 AM  Result Value Ref Range   MRSA by PCR NEGATIVE NEGATIVE    Comment:        The GeneXpert MRSA Assay (FDA approved for NASAL specimens only), is one component of a comprehensive MRSA colonization surveillance program. It is not intended to diagnose MRSA infection nor to guide or monitor treatment for MRSA infections.   Troponin I     Status: Abnormal   Collection Time: 04/10/16  8:36 AM  Result Value Ref Range   Troponin I 0.05 (HH) <0.03 ng/mL    Comment: CRITICAL RESULT CALLED TO, READ BACK BY AND VERIFIED WITH MATTHEW WRIGHT AT 1026 04/10/16 DAS   Troponin I     Status: None   Collection Time: 04/10/16  2:42 PM  Result Value Ref Range   Troponin I <0.03 <0.03 ng/mL    Current Facility-Administered Medications  Medication Dose Route Frequency Provider Last Rate Last Dose  . 0.9 % NaCl with KCl 20 mEq/ L  infusion   Intravenous Continuous Theodoro Grist, MD 100 mL/hr at 04/10/16 1554    . acetaminophen (TYLENOL) tablet 650 mg  650 mg Oral Q6H PRN Saundra Shelling, MD       Or  . acetaminophen (TYLENOL) suppository 650 mg  650 mg Rectal Q6H PRN Pavan Pyreddy, MD      . clindamycin (CLEOCIN) IVPB 900 mg  900 mg Intravenous Q8H Pavan Pyreddy, MD   900 mg at 04/10/16 1554  . enoxaparin (LOVENOX) injection 40 mg  40 mg Subcutaneous Q24H Pavan Pyreddy, MD      . folic acid (FOLVITE) tablet 1 mg  1 mg Oral Daily Saundra Shelling, MD   1 mg at 04/10/16 0945  . ipratropium-albuterol (DUONEB) 0.5-2.5 (3) MG/3ML nebulizer solution 3 mL  3 mL Nebulization Q4H Theodoro Grist, MD   3 mL at 04/10/16 1552  . LORazepam (ATIVAN) injection 2 mg  2 mg Intravenous Q4H PRN Pavan Pyreddy, MD      . nicotine (NICODERM CQ - dosed in mg/24 hours) patch 21 mg  21 mg Transdermal Daily Theodoro Grist, MD   21 mg at 04/10/16 1559  . ondansetron (ZOFRAN) tablet 4 mg  4 mg Oral Q6H PRN Saundra Shelling, MD       Or  .  ondansetron (ZOFRAN) injection 4 mg  4 mg Intravenous Q6H PRN Pavan Pyreddy, MD      . senna-docusate (Senokot-S) tablet 1 tablet  1 tablet Oral QHS PRN Pavan Pyreddy, MD      . sodium chloride flush (NS) 0.9 % injection 3 mL  3 mL Intravenous Q12H Pavan Pyreddy, MD   3 mL at 04/10/16 0945  . thiamine (VITAMIN B-1) tablet 100 mg  100 mg Oral Daily Saundra Shelling, MD   100 mg at 04/10/16 0944    Musculoskeletal: Strength & Muscle Tone: within normal limits Gait & Station: unsteady Patient leans: N/A  Psychiatric Specialty Exam: Physical Exam  Nursing note and vitals reviewed. Constitutional: He appears well-developed and well-nourished.  HENT:  Head: Normocephalic and atraumatic.  Eyes: Conjunctivae are normal. Pupils are equal, round, and reactive to light.  Neck: Normal range of motion.  Cardiovascular: Normal  heart sounds.   Respiratory: Effort normal. No respiratory distress.  GI: Soft.  Musculoskeletal: Normal range of motion.  Neurological: He is alert.  Skin: Skin is warm and dry.  Psychiatric: He has a normal mood and affect. His speech is normal and behavior is normal. Judgment and thought content normal. He exhibits abnormal recent memory.    Review of Systems  Constitutional: Negative.   HENT: Negative.   Eyes: Negative.   Respiratory: Negative.   Cardiovascular: Negative.   Gastrointestinal: Negative.   Musculoskeletal: Negative.   Skin: Negative.   Neurological: Negative.   Psychiatric/Behavioral: Positive for hallucinations, memory loss and substance abuse. Negative for depression and suicidal ideas. The patient is nervous/anxious and has insomnia.     Blood pressure 133/77, pulse (!) 101, temperature 98 F (36.7 C), temperature source Oral, resp. rate 17, height 5' 9"  (1.753 m), weight 101.9 kg (224 lb 11.2 oz), SpO2 99 %.Body mass index is 33.18 kg/m.  General Appearance: Casual  Eye Contact:  Fair  Speech:  Clear and Coherent  Volume:  Decreased  Mood:   Anxious  Affect:  Full Range  Thought Process:  Coherent  Orientation:  Full (Time, Place, and Person)  Thought Content:  Logical  Suicidal Thoughts:  No  Homicidal Thoughts:  No  Memory:  Immediate;   Good Recent;   Fair Remote;   Fair  Judgement:  Fair  Insight:  Fair  Psychomotor Activity:  Normal  Concentration:  Concentration: Fair  Recall:  [employer] of Knowledge:  Fair  Language:  Fair  Akathisia:  No  Handed:  Right  AIMS (if indicated):     Assets:  Housing Physical Health  ADL's:  Intact  Cognition:  WNL  Sleep:        Treatment Plan Summary: Plan 40 year old man who was delirious yesterday and seems to have improved quite a bit today. He was completely alert and oriented and his short-term memory was even intact. Denies any mood symptoms. Denies any active psychotic symptoms now. Has good insight into his drug use having been the cause of his problem. This appears to be a psychoactive substance induced delirium. Anecdotally it is not the first person we have seen coming in after consumption of designer drugs who remained delirious for several days after discontinuing his use. I discussed all this with the patient. Patient will be given information about substance abuse treatment in the community. At this point no indication to start any psychiatric medicine. I will follow-up as needed.  Disposition: Patient does not meet criteria for psychiatric inpatient admission.  Alethia Berthold, MD 04/10/2016 6:47 PM

## 2016-04-10 NOTE — ED Notes (Signed)
Pt. Provided [place] sandwich tray per request with admitting MD approval.

## 2016-04-10 NOTE — Progress Notes (Signed)
  Pharmacy Antibiotic Note  Cody BroachBobby L Rojas is a 40 y.o. male admitted on 04/09/2016 with pneumonia.  Pharmacy has been consulted for Levaquin dosing.  Plan: Levaquin 750 mg IV q 24 hours ordered.   Height: 5\' 9"  (175.3 cm) Weight: 224 lb 11.2 oz (101.9 kg) IBW/kg (Calculated) : 70.7  Temp (24hrs), Avg:98.4 F (36.9 C), Min:98.3 F (36.8 C), Max:98.4 F (36.9 C)   Recent Labs Lab 04/09/16 1349 04/09/16 1540 04/09/16 2250  WBC 24.9*  --   --   CREATININE 1.39*  --  1.13  LATICACIDVEN  --  1.1  --     Estimated Creatinine Clearance: 103.3 mL/min (by C-G formula based on SCr of 1.13 mg/dL).    No Known Allergies  Antimicrobials this admission: Levaquin  >>  clindamycin  >>   Dose adjustments this admission:   Microbiology results: No micro  8/7 CXR: L minimal opacity 8/7 : UA: (-)  Thank you for allowing pharmacy to be a part of this patient's care.  Arlee Santosuosso S 04/10/2016 3:15 AM

## 2016-04-10 NOTE — Clinical Social Work Note (Signed)
Clinical Social Work Assessment  Patient Details  Name: Cody Rojas MRN: [id number] Date of Birth: 1976/04/05  Date of referral:  04/10/16               Reason for consult:  Substance Use/ETOH Abuse                Permission sought to share information with:    Permission granted to share information::     Name::        Agency::     Relationship::     Contact Information:     Housing/Transportation Living arrangements for the past 2 months:   (Patient reports that he has been living with "his girl") Source of Information:  Patient Patient Interpreter Needed:  None Criminal Activity/Legal Involvement Pertinent to Current Situation/Hospitalization:   (Recently released from jail for charges unknown to this CSW) Significant Relationships:  Significant Other, Siblings (mentions "several cousins" in the area) Lives with:  Significant Other Do you feel safe going back to the place where you live?    Need for family participation in patient care:  No (Coment)  Care giving concerns:  None; patient is independent in ADL's.   Social Worker assessment / plan:  CSW met with patient this morning. CSW checked with patient's nurse prior to [place] speaking with patient to ensure patient was able to answer questions and had no current behavioral issues related to his withdrawal. Patient was very restless and at times his answers were not coherent but overall he was able to answer CSW questions appropriately. Patient reported to CSW that he had recently been in jail and that he supposedly was living with "his girl" but does not think he can return there when he leaves the hospital. Patient confirms that he has relatives in town which include some cousins and a sister. Patient states to this CSW that he will be able to find somewhere to live at discharge. CSW discussed his illicit drug use and provided supportive listening however, patient tells CSW that he is not yet ready to consider drug rehab and is not  interested in it at this time. Patient was willing for CSW to leave outpatient drug rehab resources: [facility] and [facility] facilities.   Employment status:  Unemployed Forensic scientist:  Self Pay (Medicaid Pending) PT Recommendations:  Not assessed at this time Information / Referral to community resources:     Patient/Family's Response to care:  Patient receptive to [facility] visit however was not interested in resources/options for drug rehab at this time.  Patient/Family's Understanding of and Emotional Response to Diagnosis, Current Treatment, and Prognosis:  Patient very restless and currently is stating to this CSW "I might as well leave because I am not doing anything here." CSW encouraged patient to remain and rest and patient complied.  Emotional Assessment Appearance:  Appears stated age Attitude/Demeanor/Rapport:  Apprehensive Affect (typically observed):  Restless, Calm, Pleasant Orientation:  Oriented to Self, Oriented to Place, Oriented to Situation Alcohol / Substance use:  Illicit Drugs Psych involvement (Current and /or in the community):  Yes (Comment) (psychiatric consult pending)  Discharge Needs  Concerns to be addressed:  Substance Abuse Concerns Readmission within the last 30 days:  No Current discharge risk:  Substance Abuse Barriers to Discharge:  No Barriers Identified   Shela Leff, LCSW 04/10/2016, 9:59 AM

## 2016-04-11 DIAGNOSIS — J441 Chronic obstructive pulmonary disease with (acute) exacerbation: Secondary | ICD-10-CM

## 2016-04-11 DIAGNOSIS — R000 Tachycardia, unspecified: Secondary | ICD-10-CM

## 2016-04-11 DIAGNOSIS — D72829 Elevated white blood cell count, unspecified: Secondary | ICD-10-CM

## 2016-04-11 DIAGNOSIS — F1410 Cocaine abuse, uncomplicated: Secondary | ICD-10-CM

## 2016-04-11 DIAGNOSIS — R778 Other specified abnormalities of plasma proteins: Secondary | ICD-10-CM

## 2016-04-11 DIAGNOSIS — I951 Orthostatic hypotension: Secondary | ICD-10-CM

## 2016-04-11 DIAGNOSIS — Z716 Tobacco abuse counseling: Secondary | ICD-10-CM

## 2016-04-11 DIAGNOSIS — F1210 Cannabis abuse, uncomplicated: Secondary | ICD-10-CM

## 2016-04-11 DIAGNOSIS — R7989 Other specified abnormal findings of blood chemistry: Secondary | ICD-10-CM

## 2016-04-11 DIAGNOSIS — E876 Hypokalemia: Secondary | ICD-10-CM

## 2016-04-11 LAB — CBC
HEMATOCRIT: 39.6 % — AB (ref 40.0–52.0)
HEMOGLOBIN: 13.7 g/dL (ref 13.0–18.0)
MCH: 30.8 pg (ref 26.0–34.0)
MCHC: 34.5 g/dL (ref 32.0–36.0)
MCV: 89.2 fL (ref 80.0–100.0)
PLATELETS: 160 10*3/uL (ref 150–440)
RBC: 4.44 MIL/uL (ref 4.40–5.90)
RDW: 13.6 % (ref 11.5–14.5)
WBC: 9.1 10*3/uL (ref 3.8–10.6)

## 2016-04-11 LAB — TSH: TSH: 0.903 u[IU]/mL (ref 0.350–4.500)

## 2016-04-11 LAB — ECHOCARDIOGRAM COMPLETE
HEIGHTINCHES: 69 in
WEIGHTICAEL: 3595.2 [oz_av]

## 2016-04-11 LAB — POTASSIUM: Potassium: 3.8 mmol/L (ref 3.5–5.1)

## 2016-04-11 LAB — CK: Total CK: 2881 U/L — ABNORMAL HIGH (ref 49–397)

## 2016-04-11 MED ORDER — CLINDAMYCIN HCL 300 MG PO CAPS: 300.0000 mg | ORAL_CAPSULE | Freq: Three times a day (TID) | ORAL | 0 refills | Status: AC

## 2016-04-11 MED ORDER — FOLIC ACID 1 MG PO TABS: 1.0000 mg | ORAL_TABLET | Freq: Every day | ORAL | 3 refills | Status: AC

## 2016-04-11 MED ORDER — NICOTINE 21 MG/24HR TD PT24: 21.0000 mg | MEDICATED_PATCH | Freq: Every day | TRANSDERMAL | 0 refills | Status: AC

## 2016-04-11 MED ORDER — SODIUM CHLORIDE 0.9 % IV BOLUS (SEPSIS): 1000.0000 mL | INTRAVENOUS | Status: DC | PRN

## 2016-04-11 MED ORDER — ALBUTEROL SULFATE HFA 108 (90 BASE) MCG/ACT IN AERS: 2.0000 | INHALATION_SPRAY | Freq: Four times a day (QID) | RESPIRATORY_TRACT | 2 refills | Status: AC | PRN

## 2016-04-11 MED ORDER — IPRATROPIUM-ALBUTEROL 0.5-2.5 (3) MG/3ML IN SOLN
3.0000 mL | Freq: Four times a day (QID) | RESPIRATORY_TRACT | Status: DC
  Administered 2016-04-11: 3 mL via RESPIRATORY_TRACT
  Filled 2016-04-11 (×2): qty 3

## 2016-04-11 MED ORDER — THIAMINE HCL 100 MG PO TABS: 100.0000 mg | ORAL_TABLET | Freq: Every day | ORAL | 3 refills | Status: AC

## 2016-04-11 NOTE — Progress Notes (Signed)
Went into room to give patient bolus per MD order and patient states he refuses to get the medication. He has coordinated a ride to come and he stated he did all of these things to get someone to come and pick him up and he doesn't want to wait a few hours before he can leave. MD paged, acknowledged, and she stated to instruct the patient to drink plenty of water once he goes home. Will prepare discharge now.

## 2016-04-11 NOTE — Progress Notes (Signed)
Notified Dr. [place] that patient's heart rate is going up to 150 when ambulating. MD stated to check orthostatics and oxygen saturation while ambulating. Will check and get back with MD before discharge. 

## 2016-04-11 NOTE — Progress Notes (Addendum)
Discharge instructions given. IV and tele removed. Prescriptions given to patient. Patient verbalized understanding.

## 2016-04-11 NOTE — Progress Notes (Signed)
Patient's heart rate increased to the 140's while standing bathing himself at the sink in the room. Once patient sat back down heart rate is down to around 110. Patient asymptomatic. Will continue to monitor.
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
## 2016-04-11 NOTE — Progress Notes (Signed)
SATURATION QUALIFICATIONS: (This note is used to comply with regulatory documentation for home oxygen)  Patient Saturations on Room Air at Rest = 100%  Patient Saturations on Room Air while Ambulating = 100%  

## 2017-03-25 ENCOUNTER — Emergency Department
Admission: EM | Admit: 2017-03-25 | Discharge: 2017-03-25 | Disposition: A | Discharge status: Home / Self Care | Payer: Self-pay | Attending: Emergency Medicine | Admitting: Emergency Medicine

## 2017-03-25 ENCOUNTER — Emergency Department: Payer: Self-pay

## 2017-03-25 DIAGNOSIS — Y9301 Activity, walking, marching and hiking: Secondary | ICD-10-CM | POA: Insufficient documentation

## 2017-03-25 DIAGNOSIS — Y929 Unspecified place or not applicable: Secondary | ICD-10-CM | POA: Insufficient documentation

## 2017-03-25 DIAGNOSIS — Y999 Unspecified external cause status: Secondary | ICD-10-CM | POA: Insufficient documentation

## 2017-03-25 DIAGNOSIS — E119 Type 2 diabetes mellitus without complications: Secondary | ICD-10-CM | POA: Insufficient documentation

## 2017-03-25 DIAGNOSIS — Z79899 Other long term (current) drug therapy: Secondary | ICD-10-CM | POA: Insufficient documentation

## 2017-03-25 DIAGNOSIS — F17210 Nicotine dependence, cigarettes, uncomplicated: Secondary | ICD-10-CM | POA: Insufficient documentation

## 2017-03-25 DIAGNOSIS — S90112A Contusion of left great toe without damage to nail, initial encounter: Secondary | ICD-10-CM | POA: Insufficient documentation

## 2017-03-25 DIAGNOSIS — J449 Chronic obstructive pulmonary disease, unspecified: Secondary | ICD-10-CM | POA: Insufficient documentation

## 2017-03-25 DIAGNOSIS — Z008 Encounter for other general examination: Secondary | ICD-10-CM | POA: Insufficient documentation

## 2017-03-25 DIAGNOSIS — W2209XA Striking against other stationary object, initial encounter: Secondary | ICD-10-CM | POA: Insufficient documentation

## 2017-03-25 HISTORY — DX: Type 2 diabetes mellitus without complications: E11.9

## 2017-03-25 LAB — GLUCOSE, CAPILLARY: Glucose-Capillary: 96 mg/dL (ref 65–99)

## 2017-03-25 NOTE — ED Provider Notes (Signed)
[facility] Emergency Department Provider Note  ____________________________________________  Time seen: Approximately 7:52 PM  I have reviewed the triage vital signs and the nursing notes.   HISTORY  Chief Complaint Medical Clearance    HPI Cody Rojas is a 41 y.o. male who presents emergency department complaining of great toe pain on the left foot. Patient reports that he stubbed his toe yesterday and reinjured it today. Patient is able to toe. No medications prior to arrival. Patient denies any other injuries or complaints.  Patient is in the custody [employer]. Exam is for medical clearance for patient's great toe.   Past Medical History:  Diagnosis Date  . Diabetes mellitus without complication (HCC)   . Substance abuse     Patient Active Problem List   Diagnosis Date Noted  . Sinus tachycardia 04/11/2016  . Cocaine abuse 04/11/2016  . Marijuana abuse 04/11/2016  . Elevated troponin 04/11/2016  . Hypokalemia 04/11/2016  . Leukocytosis 04/11/2016  . COPD exacerbation (HCC) 04/11/2016  . Tobacco abuse counseling 04/11/2016  . Orthostatic hypotension 04/11/2016  . Altered mental status 04/10/2016  . Rhabdomyolysis 04/10/2016  . 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 BACK SURGERY      Prior to Admission medications   Medication Sig Start Date End Date Taking? Authorizing Provider  albuterol (PROVENTIL HFA;VENTOLIN HFA) 108 (90 Base) MCG/ACT inhaler Inhale 2 puffs into the lungs every 6 (six) hours as needed for wheezing or shortness of breath. 04/11/16   Katharina Caper, MD  clindamycin (CLEOCIN) 300 MG capsule Take 1 capsule (300 mg total) by mouth 3 (three) times daily. 04/11/16   Katharina Caper, MD  folic acid (FOLVITE) 1 MG tablet Take 1 tablet (1 mg total) by mouth daily. 04/11/16   Katharina Caper, MD  nicotine (NICODERM CQ - DOSED IN  MG/24 HOURS) 21 mg/24hr patch Place 1 patch (21 mg total) onto the skin daily. 04/11/16   Katharina Caper, MD  thiamine 100 MG tablet Take 1 tablet (100 mg total) by mouth daily. 04/11/16   Katharina Caper, MD    Allergies Patient has no known allergies.  Family History  Problem Relation Age of Onset  . Family history unknown: Yes    Social History Social History  Substance Use Topics  . Smoking status: Current Every Day Smoker    Types: Cigarettes  . Smokeless tobacco: Never Used  . Alcohol use Yes     Review of Systems  Constitutional: No fever/chills Eyes: No visual changes. Cardiovascular: no chest pain. Respiratory: no cough. No SOB. Gastrointestinal: No abdominal pain.  No nausea, no vomiting.   Musculoskeletal: Positive for toe pain to the great toe of the left foot Skin: Negative for rash, abrasions, lacerations, ecchymosis. Neurological: Negative for headaches, focal weakness or numbness. 10-point ROS otherwise negative.  ____________________________________________   PHYSICAL EXAM:  VITAL SIGNS: ED Triage Vitals  Enc Vitals Group     BP 03/25/17 1912 127/83     Pulse Rate 03/25/17 1912 95     Resp 03/25/17 1912 18     Temp 03/25/17 1912 99.3 F (37.4 C)     Temp Source 03/25/17 1912 Oral     SpO2 03/25/17 1912 98 %     Weight 03/25/17 1905 220 lb (99.8 kg)     Height 03/25/17 1905 6' (1.829 m)     Head Circumference --      Peak Flow --  Pain Score 03/25/17 1905 8     Pain Loc --      Pain Edu? --      Excl. in GC? --      Constitutional: Alert and oriented. Well appearing and in no acute distress. Eyes: Conjunctivae are normal. PERRL. EOMI. Head: Atraumatic. Neck: No stridor.    Cardiovascular: Normal rate, regular rhythm. Normal S1 and S2.  Good peripheral circulation. Respiratory: Normal respiratory effort without tachypnea or retractions. Lungs CTAB. Good air entry to the bases with no decreased or absent breath sounds. Musculoskeletal:  Full range of motion to all extremities. No gross deformities appreciated. No deformity, ecchymosis, edema noted to the great toe left foot. Full range of motion. Patient is tender to palpation at the MTP joint. No palpable abnormality. Sensation and cap refill intact distally. No other tenderness to palpation of the osseous structures of the foot. Examination of ankle and all other digits is unremarkable. Neurologic:  Normal speech and language. No gross focal neurologic deficits are appreciated.  Skin:  Skin is warm, dry and intact. No rash noted. Psychiatric: Mood and affect are normal. Speech and behavior are normal. Patient exhibits appropriate insight and judgement.   ____________________________________________   LABS (all labs ordered are listed, but only abnormal results are displayed)  Labs Reviewed  GLUCOSE, CAPILLARY   ____________________________________________  EKG   ____________________________________________  RADIOLOGY Festus Barren Cuthriell, personally viewed and evaluated these images (plain radiographs) as part of my medical decision making, as well as reviewing the written report by the radiologist.  Dg Foot Complete Left  Result Date: 03/25/2017 CLINICAL DATA:  41 year old male with medial left foot pain after falling yesterday and again today EXAM: LEFT FOOT - COMPLETE 3+ VIEW COMPARISON:  None. FINDINGS: There is no evidence of fracture or dislocation. There is no evidence of arthropathy or other focal bone abnormality. Soft tissues are unremarkable. IMPRESSION: Negative. Electronically Signed   By: Malachy Moan M.D.   On: 03/25/2017 19:24    ____________________________________________    PROCEDURES  Procedure(s) performed:    Procedures    Medications - No data to display   ____________________________________________   INITIAL IMPRESSION / ASSESSMENT AND PLAN / ED COURSE  Pertinent labs & imaging results that were available during my  care of the patient were reviewed by me and considered in my medical decision making (see chart for details).  Review of the [place] CSRS was performed in accordance of the [facility] prior to dispensing any controlled drugs.     Patient's diagnosis is consistent with Contusion to the great toe of the left foot. X-ray reveals no acute osseous abnormality. Exam is reassuring. No indication for further workup.. Patient is discharged in the custody of [facility] [employer]. Instructions for jail are to include Tylenol, 1000 mg every 8 hours as needed for pain as well as Motrin, 600 mg every 4-6 hours as needed for pain. Patient will follow-up with primary care upon his release or follow-up with nursing staff in the jail. Patient is given ED precautions to return to the ED for any worsening or new symptoms.     ____________________________________________  FINAL CLINICAL IMPRESSION(S) / ED DIAGNOSES  Final diagnoses:  Contusion of left great toe without damage to nail, initial encounter  Medical clearance for incarceration      NEW MEDICATIONS STARTED DURING THIS VISIT:  Discharge Medication List as of 03/25/2017  7:59 PM          This chart was dictated using  voice recognition software/Dragon. Despite best efforts to proofread, errors can occur which can change the meaning. Any change was purely unintentional.    Racheal PatchesCuthriell, Jonathan D, PA-C 03/25/17 2017    Phineas SemenGoodman, Graydon, MD 03/25/17 2032

## 2017-03-25 NOTE — ED Triage Notes (Signed)
Pt arrives to ED in custody of [place] [employer] for medical clearance. Pt reports LEFT toe injury yesterday and re-injured it today. Pt reports falling on toe and thinks it might be broken/dislocated. No obvious swelling or deformity, CMS intact.

## 2017-03-25 NOTE — Discharge Instructions (Signed)
Patient may receive 1000 mg of Tylenol every 8 hours as needed for pain. He may receive 600 mg of ibuprofen every 4 hours as needed as needed for pain.

## 2018-04-09 IMAGING — DX DG FOOT COMPLETE 3+V*L*
3 series · 3 of 3 positions shown · non-contrast
Comparison: None.

CLINICAL DATA: 41-year-old male with medial left foot pain after
falling yesterday and again today

EXAM:
LEFT FOOT - COMPLETE 3+ VIEW

[foot ap]
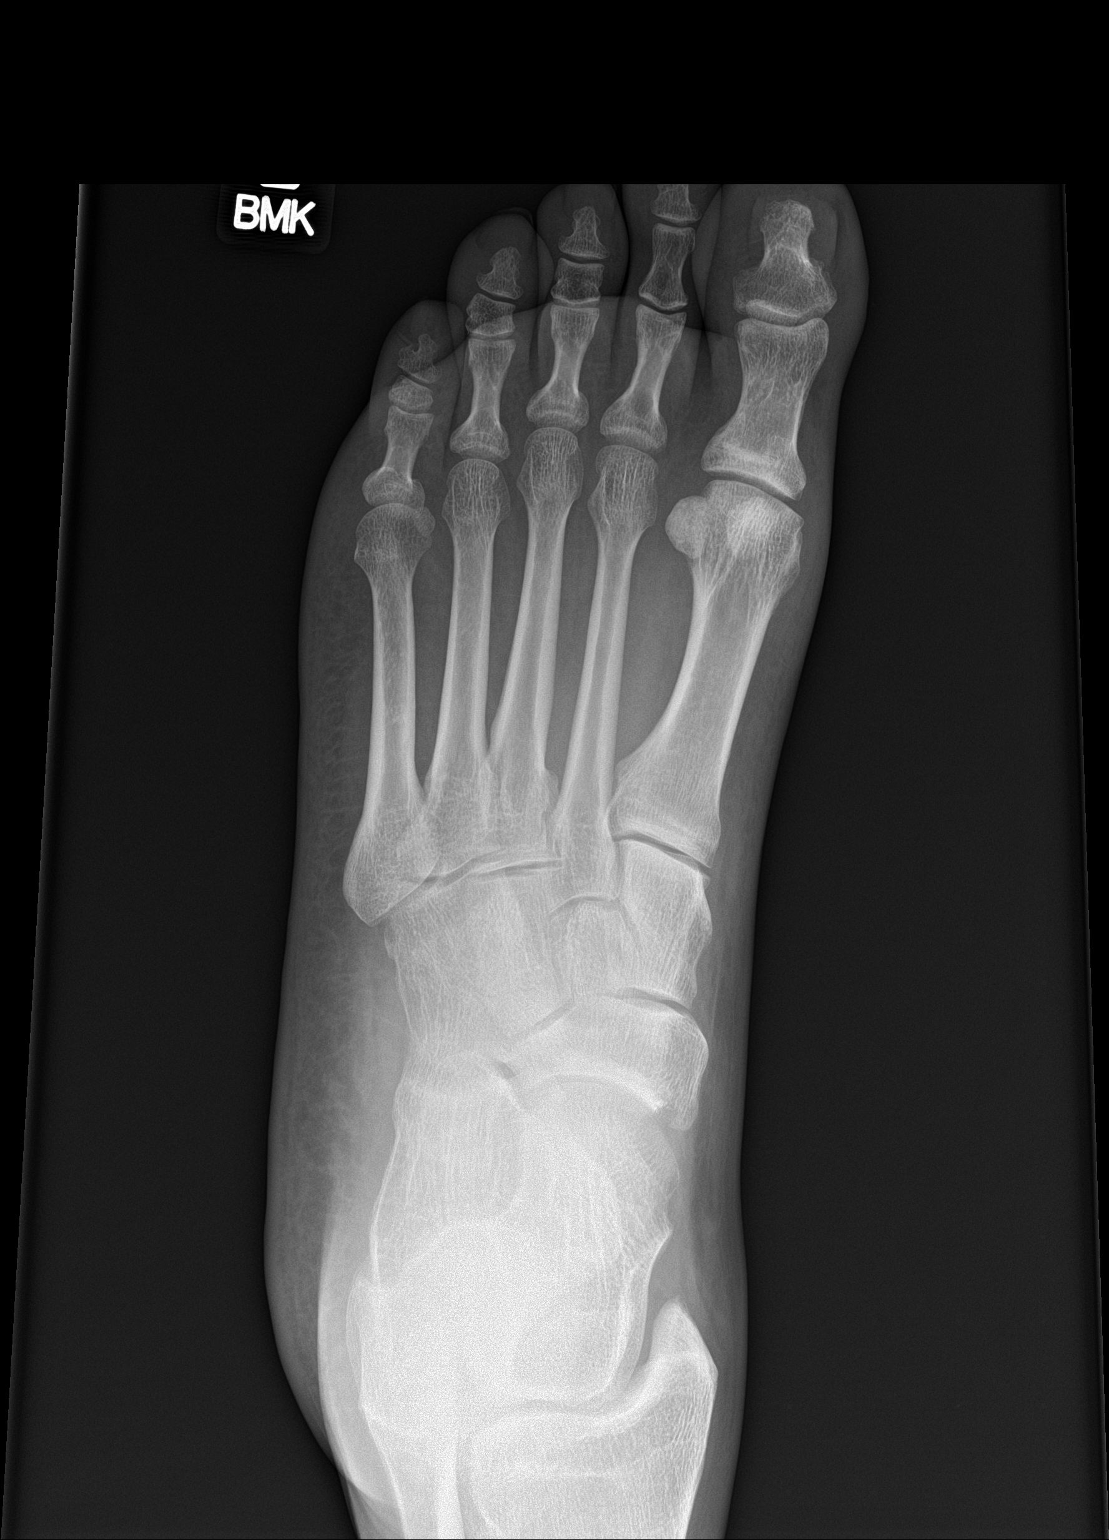

[foot obl]
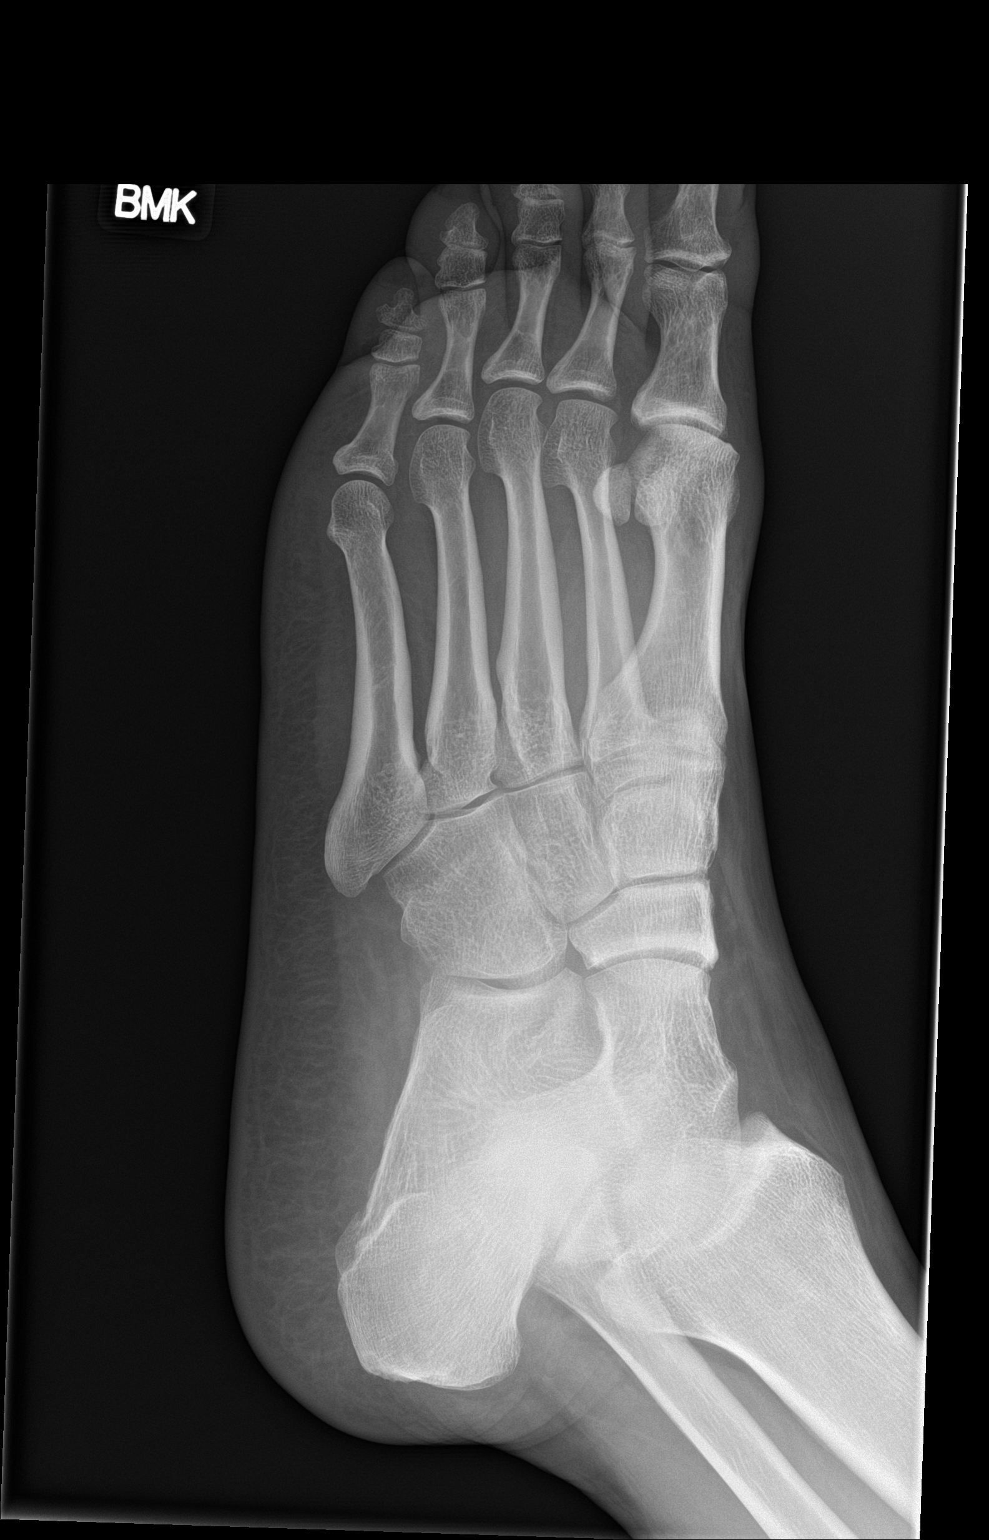

[foot lat]
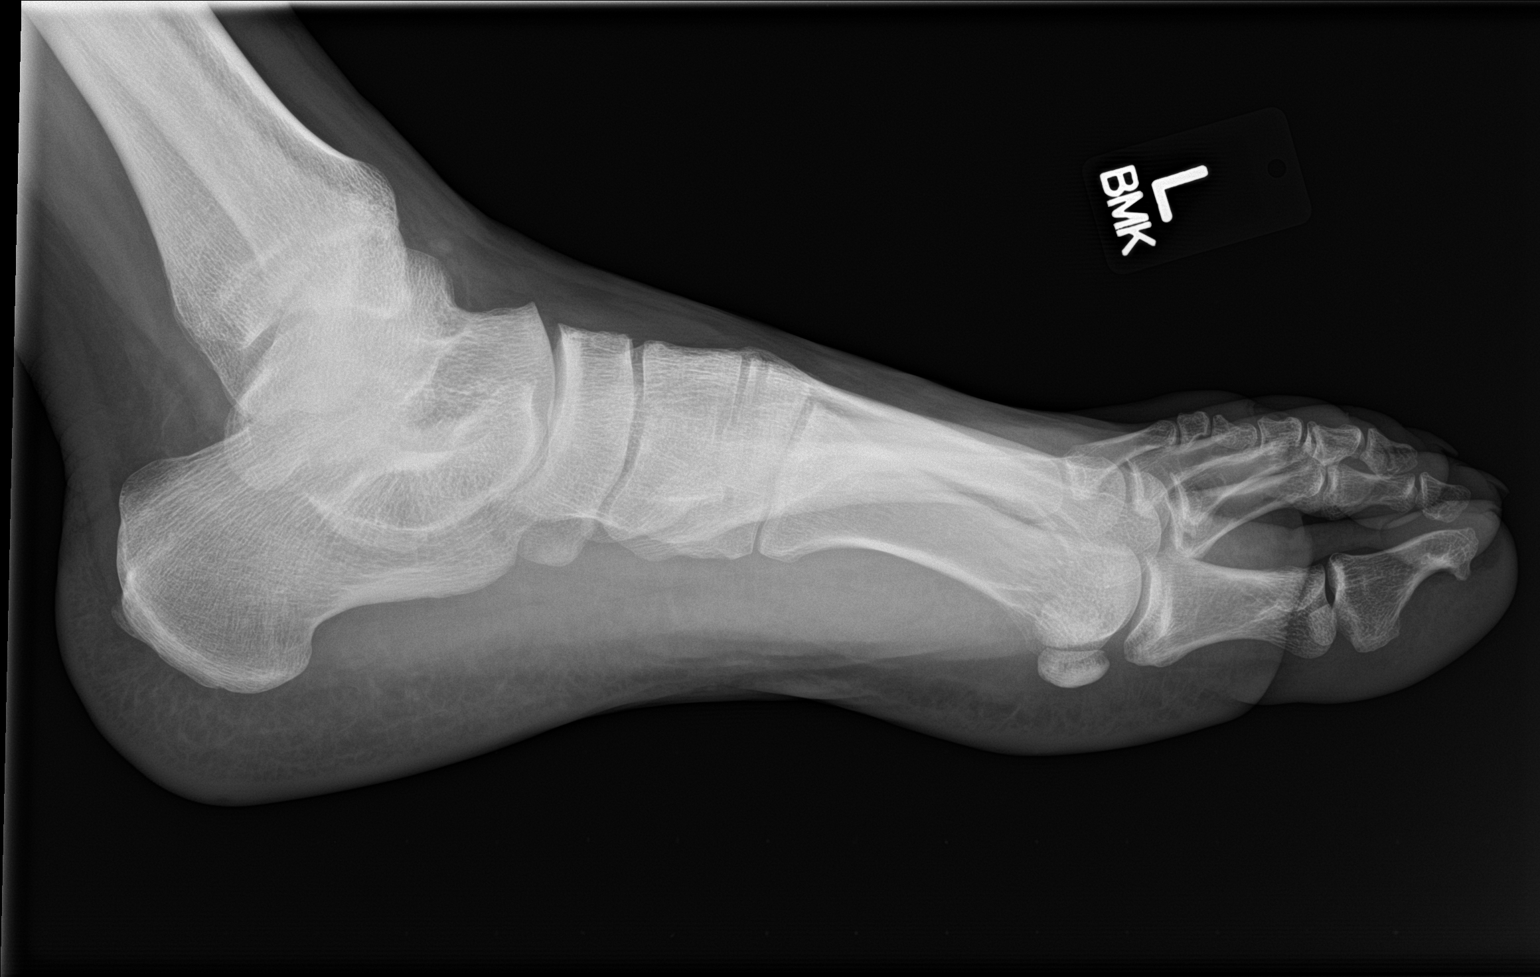

[3 of 3 positions shown; findings below may reference images not displayed]

FINDINGS: There is no evidence of fracture or dislocation. There is no
evidence of arthropathy or other focal bone abnormality. Soft
tissues are unremarkable.
IMPRESSION: Negative.

## 2018-06-06 ENCOUNTER — Inpatient Hospital Stay
Admission: EM | Admit: 2018-06-06 | Discharge: 2018-06-09 | DRG: 558 | Disposition: A | Discharge status: Home / Self Care | Payer: Self-pay | Attending: Internal Medicine | Admitting: Internal Medicine

## 2018-06-06 ENCOUNTER — Emergency Department: Payer: Self-pay

## 2018-06-06 ENCOUNTER — Emergency Department: Admission: EM | Admit: 2018-06-06 | Payer: Self-pay | Source: Home / Self Care

## 2018-06-06 DIAGNOSIS — R748 Abnormal levels of other serum enzymes: Secondary | ICD-10-CM

## 2018-06-06 DIAGNOSIS — M25511 Pain in right shoulder: Secondary | ICD-10-CM

## 2018-06-06 DIAGNOSIS — Z781 Physical restraint status: Secondary | ICD-10-CM

## 2018-06-06 DIAGNOSIS — F17200 Nicotine dependence, unspecified, uncomplicated: Secondary | ICD-10-CM | POA: Diagnosis present

## 2018-06-06 DIAGNOSIS — F10239 Alcohol dependence with withdrawal, unspecified: Secondary | ICD-10-CM | POA: Diagnosis present

## 2018-06-06 DIAGNOSIS — R000 Tachycardia, unspecified: Secondary | ICD-10-CM | POA: Diagnosis present

## 2018-06-06 DIAGNOSIS — N179 Acute kidney failure, unspecified: Secondary | ICD-10-CM | POA: Diagnosis present

## 2018-06-06 DIAGNOSIS — S01311A Laceration without foreign body of right ear, initial encounter: Secondary | ICD-10-CM | POA: Diagnosis present

## 2018-06-06 DIAGNOSIS — E860 Dehydration: Secondary | ICD-10-CM | POA: Diagnosis present

## 2018-06-06 DIAGNOSIS — F10229 Alcohol dependence with intoxication, unspecified: Secondary | ICD-10-CM | POA: Diagnosis present

## 2018-06-06 DIAGNOSIS — G4730 Sleep apnea, unspecified: Secondary | ICD-10-CM | POA: Diagnosis present

## 2018-06-06 DIAGNOSIS — M6282 Rhabdomyolysis: Principal | ICD-10-CM | POA: Diagnosis present

## 2018-06-06 DIAGNOSIS — F1410 Cocaine abuse, uncomplicated: Secondary | ICD-10-CM | POA: Diagnosis present

## 2018-06-06 DIAGNOSIS — F10929 Alcohol use, unspecified with intoxication, unspecified: Secondary | ICD-10-CM

## 2018-06-06 LAB — GLUCOSE, CAPILLARY: Glucose-Capillary: 137 mg/dL — ABNORMAL HIGH (ref 70–99)
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
## 2018-06-06 NOTE — ED Notes (Signed)
Pt in ct at this time ° °
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
## 2018-06-07 ENCOUNTER — Inpatient Hospital Stay: Payer: Self-pay

## 2018-06-07 ENCOUNTER — Other Ambulatory Visit: Payer: Self-pay

## 2018-06-07 ENCOUNTER — Encounter: Payer: Self-pay | Admitting: Emergency Medicine

## 2018-06-07 DIAGNOSIS — M6282 Rhabdomyolysis: Secondary | ICD-10-CM | POA: Diagnosis present

## 2018-06-07 LAB — CBC WITH DIFFERENTIAL/PLATELET
BASOS PCT: 0 %
Basophils Absolute: 0 10*3/uL (ref 0–0.1)
EOS ABS: 0 10*3/uL (ref 0–0.7)
EOS PCT: 0 %
HCT: 47.6 % (ref 40.0–52.0)
Hemoglobin: 15.6 g/dL (ref 13.0–18.0)
LYMPHS ABS: 1.2 10*3/uL (ref 1.0–3.6)
Lymphocytes Relative: 7 %
MCH: 31.7 pg (ref 26.0–34.0)
MCHC: 32.7 g/dL (ref 32.0–36.0)
MCV: 96.9 fL (ref 80.0–100.0)
MONOS PCT: 4 %
Monocytes Absolute: 0.6 10*3/uL (ref 0.2–1.0)
Neutro Abs: 15.9 10*3/uL — ABNORMAL HIGH (ref 1.4–6.5)
Neutrophils Relative %: 89 %
PLATELETS: 343 10*3/uL (ref 150–440)
RBC: 4.91 MIL/uL (ref 4.40–5.90)
RDW: 14.1 % (ref 11.5–14.5)
WBC: 17.8 10*3/uL — ABNORMAL HIGH (ref 3.8–10.6)

## 2018-06-07 LAB — COMPREHENSIVE METABOLIC PANEL
ALT: 42 U/L (ref 0–44)
ANION GAP: 20 — AB (ref 5–15)
AST: 43 U/L — ABNORMAL HIGH (ref 15–41)
Albumin: 4.3 g/dL (ref 3.5–5.0)
Alkaline Phosphatase: 87 U/L (ref 38–126)
BUN: 11 mg/dL (ref 6–20)
CHLORIDE: 106 mmol/L (ref 98–111)
CO2: 15 mmol/L — AB (ref 22–32)
Calcium: 8.4 mg/dL — ABNORMAL LOW (ref 8.9–10.3)
Creatinine, Ser: 1.45 mg/dL — ABNORMAL HIGH (ref 0.61–1.24)
GFR calc Af Amer: 32 mL/min — ABNORMAL LOW (ref >60)
GFR calc non Af Amer: 28 mL/min — ABNORMAL LOW (ref >60)
Glucose, Bld: 144 mg/dL — ABNORMAL HIGH (ref 70–99)
POTASSIUM: 3.8 mmol/L (ref 3.5–5.1)
SODIUM: 141 mmol/L (ref 135–145)
Total Bilirubin: 0.7 mg/dL (ref 0.3–1.2)
Total Protein: 8.2 g/dL — ABNORMAL HIGH (ref 6.5–8.1)

## 2018-06-07 LAB — BASIC METABOLIC PANEL
ANION GAP: 11 (ref 5–15)
BUN: 9 mg/dL (ref 6–20)
CO2: 21 mmol/L — ABNORMAL LOW (ref 22–32)
CREATININE: 1 mg/dL (ref 0.61–1.24)
Calcium: 7.7 mg/dL — ABNORMAL LOW (ref 8.9–10.3)
Chloride: 111 mmol/L (ref 98–111)
GFR calc non Af Amer: >60 mL/min (ref >60)
GLUCOSE: 102 mg/dL — AB (ref 70–99)
Potassium: 3.5 mmol/L (ref 3.5–5.1)
Sodium: 143 mmol/L (ref 135–145)

## 2018-06-07 LAB — URINE DRUG SCREEN, QUALITATIVE (ARMC ONLY)
AMPHETAMINES, UR SCREEN: NOT DETECTED
BENZODIAZEPINE, UR SCRN: NOT DETECTED
Barbiturates, Ur Screen: NOT DETECTED
COCAINE METABOLITE, UR ~~LOC~~: POSITIVE — AB
Cannabinoid 50 Ng, Ur ~~LOC~~: NOT DETECTED
MDMA (ECSTASY) UR SCREEN: NOT DETECTED
Methadone Scn, Ur: NOT DETECTED
OPIATE, UR SCREEN: NOT DETECTED
Phencyclidine (PCP) Ur S: NOT DETECTED
Tricyclic, Ur Screen: NOT DETECTED

## 2018-06-07 LAB — RAPID HIV SCREEN (HIV 1/2 AB+AG)
HIV 1/2 Antibodies: NONREACTIVE
HIV-1 P24 Antigen - HIV24: NONREACTIVE

## 2018-06-07 LAB — CK
CK TOTAL: 552 U/L — AB (ref 49–397)
Total CK: 1198 U/L — ABNORMAL HIGH (ref 49–397)
Total CK: 2435 U/L — ABNORMAL HIGH (ref 49–397)

## 2018-06-07 LAB — ETHANOL: Alcohol, Ethyl (B): 201 mg/dL — ABNORMAL HIGH (ref <10)

## 2018-06-07 LAB — TSH: TSH: 0.453 u[IU]/mL (ref 0.350–4.500)

## 2018-06-07 MED ORDER — SODIUM CHLORIDE 0.9 % IV BOLUS: 1000.0000 mL | Freq: Once | INTRAVENOUS | Status: DC

## 2018-06-07 MED ORDER — THIAMINE HCL 100 MG/ML IJ SOLN
100.0000 mg | Freq: Every day | INTRAMUSCULAR | Status: DC
  Filled 2018-06-07: qty 1

## 2018-06-07 MED ORDER — ACETAMINOPHEN 325 MG PO TABS
650.0000 mg | ORAL_TABLET | Freq: Four times a day (QID) | ORAL | Status: DC | PRN
  Filled 2018-06-07: qty 2

## 2018-06-07 MED ORDER — THIAMINE HCL 100 MG/ML IJ SOLN
100.0000 mg | Freq: Once | INTRAMUSCULAR | Status: AC
  Administered 2018-06-07: 100 mg via INTRAVENOUS
  Filled 2018-06-07: qty 2

## 2018-06-07 MED ORDER — ONDANSETRON HCL 4 MG/2ML IJ SOLN: 4.0000 mg | Freq: Four times a day (QID) | INTRAMUSCULAR | Status: DC | PRN

## 2018-06-07 MED ORDER — IBUPROFEN 400 MG PO TABS: 400.0000 mg | ORAL_TABLET | Freq: Four times a day (QID) | ORAL | Status: DC | PRN

## 2018-06-07 MED ORDER — ENOXAPARIN SODIUM 40 MG/0.4ML ~~LOC~~ SOLN
40.0000 mg | SUBCUTANEOUS | Status: DC
  Administered 2018-06-07 – 2018-06-08 (×2): 40 mg via SUBCUTANEOUS
  Filled 2018-06-07 (×2): qty 0.4

## 2018-06-07 MED ORDER — LORAZEPAM 2 MG/ML IJ SOLN: 1.0000 mg | Freq: Four times a day (QID) | INTRAMUSCULAR | Status: DC | PRN

## 2018-06-07 MED ORDER — ADULT MULTIVITAMIN W/MINERALS CH
1.0000 | ORAL_TABLET | Freq: Every day | ORAL | Status: DC
  Administered 2018-06-07 – 2018-06-09 (×3): 1 via ORAL
  Filled 2018-06-07 (×3): qty 1

## 2018-06-07 MED ORDER — LORAZEPAM 1 MG PO TABS: 1.0000 mg | ORAL_TABLET | Freq: Four times a day (QID) | ORAL | Status: DC | PRN

## 2018-06-07 MED ORDER — FOLIC ACID 1 MG PO TABS
1.0000 mg | ORAL_TABLET | Freq: Every day | ORAL | Status: DC
  Administered 2018-06-07 – 2018-06-09 (×3): 1 mg via ORAL
  Filled 2018-06-07 (×3): qty 1

## 2018-06-07 MED ORDER — ACETAMINOPHEN 650 MG RE SUPP: 650.0000 mg | Freq: Four times a day (QID) | RECTAL | Status: DC | PRN

## 2018-06-07 MED ORDER — SODIUM CHLORIDE 0.9 % IV BOLUS
1000.0000 mL | Freq: Once | INTRAVENOUS | Status: AC
  Administered 2018-06-07: 1000 mL via INTRAVENOUS

## 2018-06-07 MED ORDER — SODIUM CHLORIDE 0.9 % IV SOLN
INTRAVENOUS | Status: DC
  Administered 2018-06-07 – 2018-06-09 (×7): via INTRAVENOUS

## 2018-06-07 MED ORDER — ONDANSETRON HCL 4 MG PO TABS: 4.0000 mg | ORAL_TABLET | Freq: Four times a day (QID) | ORAL | Status: DC | PRN

## 2018-06-07 MED ORDER — DOCUSATE SODIUM 100 MG PO CAPS
100.0000 mg | ORAL_CAPSULE | Freq: Two times a day (BID) | ORAL | Status: DC
  Administered 2018-06-07 – 2018-06-09 (×5): 100 mg via ORAL
  Filled 2018-06-07 (×5): qty 1

## 2018-06-07 MED ORDER — VITAMIN B-1 100 MG PO TABS
100.0000 mg | ORAL_TABLET | Freq: Every day | ORAL | Status: DC
  Administered 2018-06-07 – 2018-06-09 (×3): 100 mg via ORAL
  Filled 2018-06-07 (×3): qty 1

## 2018-06-07 MED ORDER — KETOROLAC TROMETHAMINE 30 MG/ML IJ SOLN
30.0000 mg | Freq: Four times a day (QID) | INTRAMUSCULAR | Status: DC | PRN
  Administered 2018-06-07 – 2018-06-08 (×3): 30 mg via INTRAVENOUS
  Filled 2018-06-07 (×3): qty 1

## 2018-06-07 MED ORDER — LORAZEPAM 2 MG/ML IJ SOLN
2.0000 mg | Freq: Once | INTRAMUSCULAR | Status: AC
  Administered 2018-06-07: 2 mg via INTRAVENOUS
  Filled 2018-06-07: qty 1

## 2018-06-07 NOTE — H&P (Signed)
Cody Rojas is an 42 y.o. male.   Chief Complaint: Altered mental status HPI: The patient without documented past medical history presents to the emergency department in police custody after an altercation with law enforcement.  At first the patient did not respond appropriately to commands and was very somnolent.  CT of his head and neck showed no acute abnormalities.  After rest and some fluid the patient came more responsive and appropriate.  Amatory evaluation revealed steadily increasing creatinine kinase despite fluid resuscitation which prompted the emergency department staff to call the hospitalist service for admission.  History reviewed. No pertinent past medical history. Unable to obtain history from patient due to somnolence  History reviewed. No pertinent surgical history. Unable to obtain history from patient due to somnolence   History reviewed. No pertinent family history. Unable to obtain history from patient due to somnolence  Social History:  reports that he has been smoking. He has never used smokeless tobacco. He reports that he drinks alcohol. He reports that he has current or past drug history. Drug: Cocaine.  Allergies: Not on File  Prior to Admission medications   Not on File     Results for orders placed or performed during the hospital encounter of 06/06/18 (from the past 48 hour(s))  Ethanol     Status: Abnormal   Collection Time: 06/06/18 11:40 PM  Result Value Ref Range   Alcohol, Ethyl (B) 201 (H) <10 mg/dL    Comment: (NOTE) Lowest detectable limit for serum alcohol is 10 mg/dL. For medical purposes only. Performed at [facility], [address] [place], [place] [address]   CBC with Differential/Platelet     Status: Abnormal   Collection Time: 06/06/18 11:40 PM  Result Value Ref Range   WBC 17.8 (H) 3.8 - 10.6 K/uL   RBC 4.91 4.40 - 5.90 MIL/uL   Hemoglobin 15.6 13.0 - 18.0 g/dL   HCT 47.6 40.0 - 52.0 %   MCV 96.9 80.0 - 100.0  fL   MCH 31.7 26.0 - 34.0 pg   MCHC 32.7 32.0 - 36.0 g/dL   RDW 14.1 11.5 - 14.5 %   Platelets 343 150 - 440 K/uL   Neutrophils Relative % 89 %   Neutro Abs 15.9 (H) 1.4 - 6.5 K/uL   Lymphocytes Relative 7 %   Lymphs Abs 1.2 1.0 - 3.6 K/uL   Monocytes Relative 4 %   Monocytes Absolute 0.6 0.2 - 1.0 K/uL   Eosinophils Relative 0 %   Eosinophils Absolute 0.0 0 - 0.7 K/uL   Basophils Relative 0 %   Basophils Absolute 0.0 0 - 0.1 K/uL    Comment: Performed at [facility], [address] [place], [place] [address]  Comprehensive metabolic panel     Status: Abnormal   Collection Time: 06/06/18 11:40 PM  Result Value Ref Range   Sodium 141 135 - 145 mmol/L   Potassium 3.8 3.5 - 5.1 mmol/L   Chloride 106 98 - 111 mmol/L   CO2 15 (L) 22 - 32 mmol/L   Glucose, Bld 144 (H) 70 - 99 mg/dL   BUN 11 6 - 20 mg/dL    Comment: QA FLAGS AND/OR RANGES MODIFIED BY DEMOGRAPHIC UPDATE ON 10/05 AT 0111   Creatinine, Ser 1.45 (H) 0.61 - 1.24 mg/dL   Calcium 8.4 (L) 8.9 - 10.3 mg/dL   Total Protein 8.2 (H) 6.5 - 8.1 g/dL   Albumin 4.3 3.5 - 5.0 g/dL   AST 43 (H)  15 - 41 U/L   ALT 42 0 - 44 U/L   Alkaline Phosphatase 87 38 - 126 U/L   Total Bilirubin 0.7 0.3 - 1.2 mg/dL   GFR calc non Af Amer 28 (L) >60 mL/min   GFR calc Af Amer 32 (L) >60 mL/min    Comment: (NOTE) The eGFR has been calculated using the CKD EPI equation. This calculation has not been validated in all clinical situations. eGFR's persistently <60 mL/min signify possible Chronic Kidney Disease.    Anion gap 20 (H) 5 - 15    Comment: Performed at [facility], [place], [place] [address]  Rapid HIV screen (HIV 1/2 Ab+Ag)     Status: None   Collection Time: 06/06/18 11:40 PM  Result Value Ref Range   HIV-1 P24 Antigen - HIV24 NON REACTIVE NON REACTIVE   HIV 1/2 Antibodies NON REACTIVE NON REACTIVE   Interpretation (HIV Ag Ab)      A non reactive test result means that HIV 1 or HIV 2 antibodies  and HIV 1 p24 antigen were not detected in the specimen.    Comment: Performed at [facility], [address] [place], [place] [address]  CK     Status: Abnormal   Collection Time: 06/06/18 11:40 PM  Result Value Ref Range   Total CK 552 (H) 49 - 397 U/L    Comment: Performed at [facility], [address] [place],  [address]  Urine Drug Screen, Qualitative ([place] only)     Status: Abnormal   Collection Time: 06/07/18  2:06 AM  Result Value Ref Range   Tricyclic, Ur Screen NONE DETECTED NONE DETECTED   Amphetamines, Ur Screen NONE DETECTED NONE DETECTED   MDMA (Ecstasy)Ur Screen NONE DETECTED NONE DETECTED   Cocaine Metabolite,Ur [place] POSITIVE (A) NONE DETECTED   Opiate, Ur Screen NONE DETECTED NONE DETECTED   Phencyclidine (PCP) Ur S NONE DETECTED NONE DETECTED   Cannabinoid 50 Ng, Ur [place] NONE DETECTED NONE DETECTED   Barbiturates, Ur Screen NONE DETECTED NONE DETECTED   Benzodiazepine, Ur Scrn NONE DETECTED NONE DETECTED   Methadone Scn, Ur NONE DETECTED NONE DETECTED    Comment: (NOTE) Tricyclics + metabolites, urine    Cutoff 1000 ng/mL Amphetamines + metabolites, urine  Cutoff 1000 ng/mL MDMA (Ecstasy), urine              Cutoff 500 ng/mL Cocaine Metabolite, urine          Cutoff 300 ng/mL Opiate + metabolites, urine        Cutoff 300 ng/mL Phencyclidine (PCP), urine         Cutoff 25 ng/mL Cannabinoid, urine                 Cutoff 50 ng/mL Barbiturates + metabolites, urine  Cutoff 200 ng/mL Benzodiazepine, urine              Cutoff 200 ng/mL Methadone, urine                   Cutoff 300 ng/mL The urine drug screen provides only a preliminary, unconfirmed analytical test result and should not be used for non-medical purposes. Clinical consideration and professional judgment should be applied to any positive drug screen result due to possible interfering substances. A more specific alternate chemical method must be used in order to obtain a confirmed  analytical result. Gas chromatography / mass spectrometry (GC/MS) is the preferred confirmat ory method. Performed at [facility], [address], [place], [place] [address]   Basic metabolic panel     Status: Abnormal   Collection Time: 06/07/18  2:31 AM  Result Value Ref Range   Sodium 143 135 - 145 mmol/L   Potassium 3.5 3.5 - 5.1 mmol/L   Chloride 111 98 - 111 mmol/L   CO2 21 (L) 22 - 32 mmol/L   Glucose, Bld 102 (H) 70 - 99 mg/dL   BUN 9 6 - 20 mg/dL   Creatinine, Ser 1.00 0.61 - 1.24 mg/dL   Calcium 7.7 (L) 8.9 - 10.3 mg/dL   GFR calc non Af Amer >60 >60 mL/min   GFR calc Af Amer >60 >60 mL/min    Comment: (NOTE) The eGFR has been calculated using the CKD EPI equation. This calculation has not been validated in all clinical situations. eGFR's persistently <60 mL/min signify possible Chronic Kidney Disease.    Anion gap 11 5 - 15    Comment: Performed at [facility], [address] [place], [place] [address]  CK     Status: Abnormal   Collection Time: 06/07/18  2:31 AM  Result Value Ref Range   Total CK 1,198 (H) 49 - 397 U/L    Comment: Performed at [facility], [address] [place], [place] [address]  CK     Status: Abnormal   Collection Time: 06/07/18  5:13 AM  Result Value Ref Range   Total CK 2,435 (H) 49 - 397 U/L    Comment: Performed at [facility], [address] [place], [place] [address]   Ct Head Wo Contrast  Result Date: 06/07/2018 CLINICAL DATA:  Pain after trauma. EXAM: CT HEAD WITHOUT CONTRAST CT CERVICAL SPINE WITHOUT CONTRAST TECHNIQUE: Multidetector CT imaging of the head and cervical spine was performed following the standard protocol without intravenous contrast. Multiplanar CT image reconstructions of the cervical spine were also generated. COMPARISON:  None. FINDINGS: CT HEAD FINDINGS Evaluation of the brain is markedly limited due to patient motion. Brain: No subdural, epidural, or subarachnoid  hemorrhage. The ventricles and sulci are unremarkable. No midline shift. No acute cortical ischemia or infarct identified. Very limited views of the cerebellum are normal. Basal cisterns are patent. Vascular: No hyperdense vessel or unexpected calcification. Skull: Normal. Negative for fracture or focal lesion. Sinuses/Orbits: No acute finding. Other: None. CT CERVICAL SPINE FINDINGS Severely limited evaluation due to motion. Alignment: Evaluation of alignment is nearly nondiagnostic due to motion. Pre odontoid space is not appear to be widened. Skull base and vertebrae: The study is nearly nondiagnostic in the evaluation for fractures due to motion. Soft tissues and spinal canal: No prevertebral fluid or swelling. No visible canal hematoma. Disc levels:  No significant degenerative changes. Upper chest: Negative. Other: No other abnormalities. IMPRESSION: 1. Evaluation of the brain is severely limited due to motion. No obvious bleed or acute abnormality. 2. Evaluation of the cervical spine for alignment and fracture is nondiagnostic due to motion. Recommend repeat imaging once the patient is able to cooperate. Electronically Signed   By: Dorise Bullion III M.D   On: 06/07/2018 00:13   Ct Cervical Spine Wo Contrast  Result Date: 06/07/2018 CLINICAL DATA:  Pain after trauma. EXAM: CT HEAD WITHOUT CONTRAST CT CERVICAL SPINE WITHOUT CONTRAST TECHNIQUE: Multidetector CT imaging of the head and cervical spine was performed following the standard protocol without intravenous contrast. Multiplanar CT image reconstructions of the cervical spine were also generated. COMPARISON:  None. FINDINGS: CT HEAD  FINDINGS Evaluation of the brain is markedly limited due to patient motion. Brain: No subdural, epidural, or subarachnoid hemorrhage. The ventricles and sulci are unremarkable. No midline shift. No acute cortical ischemia or infarct identified. Very limited views of the cerebellum are normal. Basal cisterns are patent.  Vascular: No hyperdense vessel or unexpected calcification. Skull: Normal. Negative for fracture or focal lesion. Sinuses/Orbits: No acute finding. Other: None. CT CERVICAL SPINE FINDINGS Severely limited evaluation due to motion. Alignment: Evaluation of alignment is nearly nondiagnostic due to motion. Pre odontoid space is not appear to be widened. Skull base and vertebrae: The study is nearly nondiagnostic in the evaluation for fractures due to motion. Soft tissues and spinal canal: No prevertebral fluid or swelling. No visible canal hematoma. Disc levels:  No significant degenerative changes. Upper chest: Negative. Other: No other abnormalities. IMPRESSION: 1. Evaluation of the brain is severely limited due to motion. No obvious bleed or acute abnormality. 2. Evaluation of the cervical spine for alignment and fracture is nondiagnostic due to motion. Recommend repeat imaging once the patient is able to cooperate. Electronically Signed   By: Dorise Bullion III M.D   On: 06/07/2018 00:13    Review of Systems  Unable to perform ROS: Other  Somnolence   Blood pressure (!) 144/85, resp. rate (!) 26. Physical Exam  Vitals reviewed. Constitutional: He is oriented to person, place, and time. He appears well-developed and well-nourished. No distress.  HENT:  Head: Normocephalic and atraumatic.  Mouth/Throat: Oropharynx is clear and moist.  Eyes: Pupils are equal, round, and reactive to light. Conjunctivae and EOM are normal. No scleral icterus.  Neck: Normal range of motion. Neck supple. No JVD present. No tracheal deviation present. No thyromegaly present.  Cardiovascular: Normal rate, regular rhythm and normal heart sounds. Exam reveals no gallop and no friction rub.  No murmur heard. Respiratory: Effort normal and breath sounds normal. No respiratory distress.  GI: Soft. Bowel sounds are normal. He exhibits no distension. There is no tenderness.  Genitourinary:  Genitourinary Comments: Deferred   Musculoskeletal: Normal range of motion. He exhibits no edema.  Lymphadenopathy:    He has no cervical adenopathy.  Neurological: He is alert and oriented to person, place, and time. No cranial nerve deficit.  Skin: Skin is warm and dry. No rash noted. No erythema.  Psychiatric: He has a normal mood and affect. His behavior is normal. Judgment and thought content normal.     Assessment/Plan This is a 42 year old male admitted for rhabdomyolysis 1.  Rhabdomyolysis: Secondary to trauma.  Hydrate with intravenous fluid.  Continue to follow CK.  Monitor telemetry for arrhythmias. 2.  Cocaine abuse: Avoid beta-blockers if needed for tachycardia. 3.  DVT prophylaxis: [place] 4.  GI prophylaxis: None The patient is full code.  Time spent on admission orders and patient care approximately 45 minutes  Harrie Foreman, MD 06/07/2018, 6:17 AM

## 2018-06-07 NOTE — Progress Notes (Addendum)
Pt admitted to floor from ED around 0900. Pt was sleeping, but easy to arouse. Pt clothes and shoes were removed and placed in a belongings bag. Sole of R shoe was only attached at the toe area. Pt is aware. Pt bathed by myself and NT Katie. Pt has abrasion on R knee, knuckle of R pinky finger, mid chest, and mid forehead. All abrasions cleansed and large knee abrasion was covered with gauze. Pt O2 sats drop to low 70's at times when sleeping per continuous pulse ox. MD made aware. Pt on 2L O2 when sleeping per MD order. Officer at bedside. Will continue to monitor.

## 2018-06-07 NOTE — ED Notes (Signed)
Pt given urinal to urinate, pt right hand cuff released to facilitate and reattached, BPD at bedside

## 2018-06-08 LAB — CK: CK TOTAL: 2796 U/L — AB (ref 49–397)

## 2018-06-08 NOTE — Progress Notes (Addendum)
[facility] - [place] [facility]   PATIENT NAME: Cody Rojas    MR#:  [id number]  DATE OF BIRTH:  1976/04/24  SUBJECTIVE:  CHIEF COMPLAINT:  No chief complaint on file.  Complains of diffuse pain all over.  REVIEW OF SYSTEMS:    Review of Systems  Constitutional: Positive for malaise/fatigue. Negative for chills and fever.  HENT: Negative for sore throat.   Eyes: Negative for blurred vision, double vision and pain.  Respiratory: Negative for cough, hemoptysis, shortness of breath and wheezing.   Cardiovascular: Negative for chest pain, palpitations, orthopnea and leg swelling.  Gastrointestinal: Negative for abdominal pain, constipation, diarrhea, heartburn, nausea and vomiting.  Genitourinary: Negative for dysuria and hematuria.  Musculoskeletal: Positive for myalgias. Negative for back pain and joint pain.  Skin: Negative for rash.  Neurological: Negative for sensory change, speech change, focal weakness and headaches.  Endo/Heme/Allergies: Does not bruise/bleed easily.  Psychiatric/Behavioral: Negative for depression. The patient is not nervous/anxious.     DRUG ALLERGIES:  Not on File  VITALS:  Blood pressure 137/90, pulse 81, temperature 98.5 F (36.9 C), temperature source Oral, resp. rate 18, height 6\' 2"  (1.88 m), weight 103.7 kg, SpO2 100 %.  PHYSICAL EXAMINATION:   Physical Exam  GENERAL:  42 y.o.-year-old patient lying in the bed with no acute distress.  EYES: Pupils equal, round, reactive to light and accommodation. No scleral icterus. Extraocular muscles intact.  HEENT: Head atraumatic, normocephalic. Oropharynx and nasopharynx clear.  NECK:  Supple, no jugular venous distention. No thyroid enlargement, no tenderness.  LUNGS: Normal breath sounds bilaterally, no wheezing, rales, rhonchi. No use of accessory muscles of respiration.  CARDIOVASCULAR: S1, S2 normal. No murmurs, rubs, or gallops.  ABDOMEN: Soft, nontender, nondistended. Bowel  sounds present. No organomegaly or mass.  EXTREMITIES: No cyanosis, clubbing or edema b/l.    NEUROLOGIC: Cranial nerves II through XII are intact. No focal Motor or sensory deficits b/l.   PSYCHIATRIC: The patient is alert and oriented x 3.  SKIN: No obvious rash, lesion, or ulcer.   LABORATORY PANEL:   CBC Recent Labs  Lab 06/06/18 2340  WBC 17.8*  HGB 15.6  HCT 47.6  PLT 343   ------------------------------------------------------------------------------------------------------------------ Chemistries  Recent Labs  Lab 06/06/18 2340 06/07/18 0231  NA 141 143  K 3.8 3.5  CL 106 111  CO2 15* 21*  GLUCOSE 144* 102*  BUN 11 9  CREATININE 1.45* 1.00  CALCIUM 8.4* 7.7*  AST 43*  --   ALT 42  --   ALKPHOS 87  --   BILITOT 0.7  --    ------------------------------------------------------------------------------------------------------------------  Cardiac Enzymes No results for input(s): TROPONINI in the last 168 hours. ------------------------------------------------------------------------------------------------------------------  RADIOLOGY:  Dg Shoulder Right  Result Date: 06/07/2018 CLINICAL DATA:  Right shoulder pain EXAM: RIGHT SHOULDER - 2+ VIEW COMPARISON:  None. FINDINGS: There is no evidence of fracture or dislocation. There is no evidence of arthropathy or other focal bone abnormality. Soft tissues are unremarkable. IMPRESSION: Negative. Electronically Signed   By: Charlett Nose M.D.   On: 06/07/2018 10:42   Ct Head Wo Contrast  Result Date: 06/07/2018 CLINICAL DATA:  Pain after trauma. EXAM: CT HEAD WITHOUT CONTRAST CT CERVICAL SPINE WITHOUT CONTRAST TECHNIQUE: Multidetector CT imaging of the head and cervical spine was performed following the standard protocol without intravenous contrast. Multiplanar CT image reconstructions of the cervical spine were also generated. COMPARISON:  None. FINDINGS: CT HEAD FINDINGS Evaluation of the brain is markedly limited due  to patient motion. Brain: No subdural, epidural, or subarachnoid hemorrhage. The ventricles and sulci are unremarkable. No midline shift. No acute cortical ischemia or infarct identified. Very limited views of the cerebellum are normal. Basal cisterns are patent. Vascular: No hyperdense vessel or unexpected calcification. Skull: Normal. Negative for fracture or focal lesion. Sinuses/Orbits: No acute finding. Other: None. CT CERVICAL SPINE FINDINGS Severely limited evaluation due to motion. Alignment: Evaluation of alignment is nearly nondiagnostic due to motion. Pre odontoid space is not appear to be widened. Skull base and vertebrae: The study is nearly nondiagnostic in the evaluation for fractures due to motion. Soft tissues and spinal canal: No prevertebral fluid or swelling. No visible canal hematoma. Disc levels:  No significant degenerative changes. Upper chest: Negative. Other: No other abnormalities. IMPRESSION: 1. Evaluation of the brain is severely limited due to motion. No obvious bleed or acute abnormality. 2. Evaluation of the cervical spine for alignment and fracture is nondiagnostic due to motion. Recommend repeat imaging once the patient is able to cooperate. Electronically Signed   By: Gerome Sam III M.D   On: 06/07/2018 00:13   Ct Cervical Spine Wo Contrast  Result Date: 06/07/2018 CLINICAL DATA:  Pain after trauma. EXAM: CT HEAD WITHOUT CONTRAST CT CERVICAL SPINE WITHOUT CONTRAST TECHNIQUE: Multidetector CT imaging of the head and cervical spine was performed following the standard protocol without intravenous contrast. Multiplanar CT image reconstructions of the cervical spine were also generated. COMPARISON:  None. FINDINGS: CT HEAD FINDINGS Evaluation of the brain is markedly limited due to patient motion. Brain: No subdural, epidural, or subarachnoid hemorrhage. The ventricles and sulci are unremarkable. No midline shift. No acute cortical ischemia or infarct identified. Very limited  views of the cerebellum are normal. Basal cisterns are patent. Vascular: No hyperdense vessel or unexpected calcification. Skull: Normal. Negative for fracture or focal lesion. Sinuses/Orbits: No acute finding. Other: None. CT CERVICAL SPINE FINDINGS Severely limited evaluation due to motion. Alignment: Evaluation of alignment is nearly nondiagnostic due to motion. Pre odontoid space is not appear to be widened. Skull base and vertebrae: The study is nearly nondiagnostic in the evaluation for fractures due to motion. Soft tissues and spinal canal: No prevertebral fluid or swelling. No visible canal hematoma. Disc levels:  No significant degenerative changes. Upper chest: Negative. Other: No other abnormalities. IMPRESSION: 1. Evaluation of the brain is severely limited due to motion. No obvious bleed or acute abnormality. 2. Evaluation of the cervical spine for alignment and fracture is nondiagnostic due to motion. Recommend repeat imaging once the patient is able to cooperate. Electronically Signed   By: Gerome Sam III M.D   On: 06/07/2018 00:13     ASSESSMENT AND PLAN:   *Rhabdomyolysis with diffuse myalgias. Likely multifactorial with cocaine, alcohol and patient was tased 3 times. Continue IV fluids.  Repeat CK in the morning.  Acute kidney injury has resolved.  *Acute kidney injury due to dehydration and rhabdomyolysis has resolved  *Alcohol withdrawals.  On CIWA protocol.  Sleep apnea.  Will need outpatient sleep study.  DVT prophylaxis with Lovenox  All the records are reviewed and case discussed with Care Management/Social Worker Management plans discussed with the patient, family and they are in agreement.  CODE STATUS: FULL CODE  DVT Prophylaxis: SCDs  TOTAL TIME TAKING CARE OF THIS PATIENT: 35 minutes.   POSSIBLE D/C IN 1-2 DAYS, DEPENDING ON CLINICAL CONDITION.  Orie Fisherman M.D on 06/08/2018 at 12:17 PM  Between 7am to 6pm - Pager - [phone]  After 6pm go to  [email or web address] - password [email or web address] [facility]  [facility] [place] [facility]  Office  [phone]  CC: Primary care physician; No primary care provider on file.  Note: This dictation was prepared with Dragon dictation along with smaller phrase technology. Any transcriptional errors that result from this process are unintentional.

## 2018-06-09 LAB — HEMOGLOBIN A1C
Hgb A1c MFr Bld: 6.1 % — ABNORMAL HIGH (ref 4.8–5.6)
Mean Plasma Glucose: 128 mg/dL

## 2018-06-09 LAB — BASIC METABOLIC PANEL
Anion gap: 4 — ABNORMAL LOW (ref 5–15)
BUN: 8 mg/dL (ref 6–20)
CALCIUM: 8.2 mg/dL — AB (ref 8.9–10.3)
CO2: 25 mmol/L (ref 22–32)
CREATININE: 0.94 mg/dL (ref 0.61–1.24)
Chloride: 111 mmol/L (ref 98–111)
GFR calc Af Amer: >60 mL/min (ref >60)
GFR calc non Af Amer: >60 mL/min (ref >60)
Glucose, Bld: 113 mg/dL — ABNORMAL HIGH (ref 70–99)
Potassium: 4.1 mmol/L (ref 3.5–5.1)
Sodium: 140 mmol/L (ref 135–145)

## 2018-06-09 LAB — CK: Total CK: 1959 U/L — ABNORMAL HIGH (ref 49–397)

## 2018-06-09 LAB — MAGNESIUM: Magnesium: 2.1 mg/dL (ref 1.7–2.4)

## 2018-06-09 NOTE — Discharge Instructions (Signed)
Patient advised to abstain from alcohol and cocaine abuse. He is advised to get primary care physician in the area

## 2018-06-09 NOTE — Progress Notes (Signed)
Pt has been discharged.  Discharge papers given and explained to pt.  Pt verbalized understanding.

## 2018-06-09 NOTE — Discharge Summary (Addendum)
[facility] NOTE REDACTION - DIFFERENT PATIENT  Due to an Incorrect Registration or Identity Theft, we are redacting this note. In these particular instances; both names are NOT the same patient.  We have changed the demographic information to reflect those of the correct patient.  Please review for accuracy and ? Mark Complt the note.  Please do not refresh the body of the note.   Should the change in demographics impact clinical decisions that had been made at the time; You may choose to Edit Note from our Note Redaction.  This will create an Addendum      Deborah Kelly [employer] Identity Application Analyst [phone] [email or web address] 

## 2020-04-18 ENCOUNTER — Ambulatory Visit: Payer: Self-pay | Attending: Internal Medicine

## 2020-04-18 DIAGNOSIS — Z23 Encounter for immunization: Secondary | ICD-10-CM

## 2020-04-18 NOTE — Progress Notes (Signed)
   Covid-19 Vaccination Clinic  Name:  Rainer Mounce    MRN: [id number] DOB: 09-10-1975  04/18/2020  Mr. Cupples was observed post Covid-19 immunization for 15 minutes without incident. He was provided with Vaccine Information Sheet and instruction to access the V-Safe system.   Mr. Mullane was instructed to call 911 with any severe reactions post vaccine: Marland Kitchen Difficulty breathing  . Swelling of face and throat  . A fast heartbeat  . A bad rash all over body  . Dizziness and weakness   Immunizations Administered    Name Date Dose VIS Date Route   Pfizer COVID-19 Vaccine 04/18/2020  4:40 PM 0.3 mL 10/28/2018 Intramuscular   Manufacturer: [employer], [employer]   Lot: [id number]   NDC: [address]-2

## 2020-05-16 ENCOUNTER — Ambulatory Visit: Payer: Self-pay

## 2021-10-11 ENCOUNTER — Emergency Department: Payer: Self-pay

## 2021-10-11 ENCOUNTER — Observation Stay
Admission: EM | Admit: 2021-10-11 | Discharge: 2021-10-12 | Disposition: A | Discharge status: Home / Self Care | Payer: Self-pay | Attending: Internal Medicine | Admitting: Internal Medicine

## 2021-10-11 ENCOUNTER — Other Ambulatory Visit: Payer: Self-pay

## 2021-10-11 ENCOUNTER — Encounter: Payer: Self-pay | Admitting: Internal Medicine

## 2021-10-11 DIAGNOSIS — Z720 Tobacco use: Secondary | ICD-10-CM

## 2021-10-11 DIAGNOSIS — R739 Hyperglycemia, unspecified: Secondary | ICD-10-CM | POA: Diagnosis present

## 2021-10-11 DIAGNOSIS — F17210 Nicotine dependence, cigarettes, uncomplicated: Secondary | ICD-10-CM | POA: Insufficient documentation

## 2021-10-11 DIAGNOSIS — G9341 Metabolic encephalopathy: Principal | ICD-10-CM | POA: Diagnosis present

## 2021-10-11 DIAGNOSIS — T401X1A Poisoning by heroin, accidental (unintentional), initial encounter: Secondary | ICD-10-CM | POA: Insufficient documentation

## 2021-10-11 DIAGNOSIS — M6282 Rhabdomyolysis: Secondary | ICD-10-CM | POA: Diagnosis present

## 2021-10-11 DIAGNOSIS — R7989 Other specified abnormal findings of blood chemistry: Secondary | ICD-10-CM | POA: Diagnosis present

## 2021-10-11 DIAGNOSIS — F1990 Other psychoactive substance use, unspecified, uncomplicated: Secondary | ICD-10-CM | POA: Insufficient documentation

## 2021-10-11 DIAGNOSIS — T50904A Poisoning by unspecified drugs, medicaments and biological substances, undetermined, initial encounter: Secondary | ICD-10-CM

## 2021-10-11 DIAGNOSIS — Z20822 Contact with and (suspected) exposure to covid-19: Secondary | ICD-10-CM | POA: Insufficient documentation

## 2021-10-11 DIAGNOSIS — F1910 Other psychoactive substance abuse, uncomplicated: Secondary | ICD-10-CM | POA: Diagnosis present

## 2021-10-11 DIAGNOSIS — J690 Pneumonitis due to inhalation of food and vomit: Secondary | ICD-10-CM

## 2021-10-11 DIAGNOSIS — R778 Other specified abnormalities of plasma proteins: Secondary | ICD-10-CM | POA: Insufficient documentation

## 2021-10-11 HISTORY — DX: Other psychoactive substance abuse, uncomplicated: F19.10

## 2021-10-11 LAB — LACTIC ACID, PLASMA
Lactic Acid, Venous: 0.9 mmol/L (ref 0.5–1.9)
Lactic Acid, Venous: 2.2 mmol/L (ref 0.5–1.9)

## 2021-10-11 LAB — URINE DRUG SCREEN, QUALITATIVE (ARMC ONLY)
Amphetamines, Ur Screen: POSITIVE — AB
Barbiturates, Ur Screen: NOT DETECTED
Benzodiazepine, Ur Scrn: NOT DETECTED
Cannabinoid 50 Ng, Ur ~~LOC~~: NOT DETECTED
Cocaine Metabolite,Ur ~~LOC~~: POSITIVE — AB
MDMA (Ecstasy)Ur Screen: NOT DETECTED
Methadone Scn, Ur: NOT DETECTED
Opiate, Ur Screen: NOT DETECTED
Phencyclidine (PCP) Ur S: NOT DETECTED
Tricyclic, Ur Screen: NOT DETECTED

## 2021-10-11 LAB — COMPREHENSIVE METABOLIC PANEL
ALT: 40 U/L (ref 0–44)
AST: 61 U/L — ABNORMAL HIGH (ref 15–41)
Albumin: 4.1 g/dL (ref 3.5–5.0)
Alkaline Phosphatase: 90 U/L (ref 38–126)
Anion gap: 9 (ref 5–15)
BUN: 14 mg/dL (ref 6–20)
CO2: 23 mmol/L (ref 22–32)
Calcium: 8.4 mg/dL — ABNORMAL LOW (ref 8.9–10.3)
Chloride: 103 mmol/L (ref 98–111)
Creatinine, Ser: 1.09 mg/dL (ref 0.61–1.24)
GFR, Estimated: >60 mL/min (ref >60)
Glucose, Bld: 255 mg/dL — ABNORMAL HIGH (ref 70–99)
Potassium: 3.9 mmol/L (ref 3.5–5.1)
Sodium: 135 mmol/L (ref 135–145)
Total Bilirubin: 0.5 mg/dL (ref 0.3–1.2)
Total Protein: 7.4 g/dL (ref 6.5–8.1)

## 2021-10-11 LAB — CBC
HCT: 48.4 % (ref 39.0–52.0)
Hemoglobin: 15.4 g/dL (ref 13.0–17.0)
MCH: 31.2 pg (ref 26.0–34.0)
MCHC: 31.8 g/dL (ref 30.0–36.0)
MCV: 98.2 fL (ref 80.0–100.0)
Platelets: 254 10*3/uL (ref 150–400)
RBC: 4.93 MIL/uL (ref 4.22–5.81)
RDW: 14.6 % (ref 11.5–15.5)
WBC: 16.1 10*3/uL — ABNORMAL HIGH (ref 4.0–10.5)
nRBC: 0 % (ref 0.0–0.2)

## 2021-10-11 LAB — GLUCOSE, CAPILLARY
Glucose-Capillary: 91 mg/dL (ref 70–99)
Glucose-Capillary: 120 mg/dL — ABNORMAL HIGH (ref 70–99)

## 2021-10-11 LAB — TROPONIN I (HIGH SENSITIVITY)
Troponin I (High Sensitivity): 10 ng/L (ref <18)
Troponin I (High Sensitivity): 22 ng/L — ABNORMAL HIGH (ref <18)
Troponin I (High Sensitivity): 24 ng/L — ABNORMAL HIGH (ref <18)
Troponin I (High Sensitivity): 19 ng/L — ABNORMAL HIGH (ref <18)

## 2021-10-11 LAB — HIV ANTIBODY (ROUTINE TESTING W REFLEX): HIV Screen 4th Generation wRfx: NONREACTIVE

## 2021-10-11 LAB — RESP PANEL BY RT-PCR (FLU A&B, COVID) ARPGX2
Influenza A by PCR: NEGATIVE
Influenza B by PCR: NEGATIVE
SARS Coronavirus 2 by RT PCR: NEGATIVE

## 2021-10-11 LAB — CK: Total CK: 590 U/L — ABNORMAL HIGH (ref 49–397)

## 2021-10-11 LAB — ETHANOL: Alcohol, Ethyl (B): 23 mg/dL — ABNORMAL HIGH (ref <10)

## 2021-10-11 LAB — HEMOGLOBIN A1C
Hgb A1c MFr Bld: 5.7 % — ABNORMAL HIGH (ref 4.8–5.6)
Mean Plasma Glucose: 116.89 mg/dL

## 2021-10-11 LAB — PROCALCITONIN: Procalcitonin: <0.1 ng/mL

## 2021-10-11 LAB — CBG MONITORING, ED: Glucose-Capillary: 98 mg/dL (ref 70–99)

## 2021-10-11 MED ORDER — ONDANSETRON HCL 4 MG/2ML IJ SOLN: 4.0000 mg | Freq: Three times a day (TID) | INTRAMUSCULAR | Status: DC | PRN

## 2021-10-11 MED ORDER — ASPIRIN EC 81 MG PO TBEC
81.0000 mg | DELAYED_RELEASE_TABLET | Freq: Every day | ORAL | Status: DC
  Administered 2021-10-12: 81 mg via ORAL
  Filled 2021-10-11: qty 1

## 2021-10-11 MED ORDER — ALBUTEROL SULFATE (2.5 MG/3ML) 0.083% IN NEBU: 2.5000 mg | INHALATION_SOLUTION | RESPIRATORY_TRACT | Status: DC | PRN

## 2021-10-11 MED ORDER — SODIUM CHLORIDE 0.9 % IV BOLUS
1000.0000 mL | Freq: Once | INTRAVENOUS | Status: AC
Start: 2021-10-11 — End: 2021-10-11
  Administered 2021-10-11: 1000 mL via INTRAVENOUS

## 2021-10-11 MED ORDER — SODIUM CHLORIDE 0.9 % IV SOLN
3.0000 g | Freq: Four times a day (QID) | INTRAVENOUS | Status: DC
  Administered 2021-10-11 – 2021-10-12 (×3): 3 g via INTRAVENOUS
  Filled 2021-10-11: qty 3
  Filled 2021-10-11 (×4): qty 8
  Filled 2021-10-11: qty 3
  Filled 2021-10-11: qty 8

## 2021-10-11 MED ORDER — NALOXONE HCL 2 MG/2ML IJ SOSY
0.4000 mg | PREFILLED_SYRINGE | Freq: Once | INTRAMUSCULAR | Status: AC
  Administered 2021-10-11: 0.4 mg via INTRAVENOUS

## 2021-10-11 MED ORDER — ONDANSETRON HCL 4 MG/2ML IJ SOLN
2.0000 mg | Freq: Once | INTRAMUSCULAR | Status: AC
Start: 2021-10-11 — End: 2021-10-11
  Administered 2021-10-11: 2 mg via INTRAVENOUS
  Filled 2021-10-11: qty 2

## 2021-10-11 MED ORDER — DM-GUAIFENESIN ER 30-600 MG PO TB12: 1.0000 | ORAL_TABLET | Freq: Two times a day (BID) | ORAL | Status: DC | PRN

## 2021-10-11 MED ORDER — ENOXAPARIN SODIUM 60 MG/0.6ML IJ SOSY
0.5000 mg/kg | PREFILLED_SYRINGE | INTRAMUSCULAR | Status: DC
  Administered 2021-10-11: 55 mg via SUBCUTANEOUS
  Filled 2021-10-11: qty 0.6

## 2021-10-11 MED ORDER — SODIUM CHLORIDE 0.9 % IV SOLN
3.0000 g | Freq: Once | INTRAVENOUS | Status: DC
  Filled 2021-10-11: qty 8

## 2021-10-11 MED ORDER — SODIUM CHLORIDE 0.9 % IV SOLN: INTRAVENOUS | Status: DC

## 2021-10-11 MED ORDER — NALOXONE HCL 2 MG/2ML IJ SOSY
PREFILLED_SYRINGE | INTRAMUSCULAR | Status: AC
  Filled 2021-10-11: qty 2

## 2021-10-11 MED ORDER — INSULIN ASPART 100 UNIT/ML IJ SOLN
0.0000 [IU] | INTRAMUSCULAR | Status: DC
  Administered 2021-10-12: 1 [IU] via SUBCUTANEOUS

## 2021-10-11 MED ORDER — NITROGLYCERIN 0.4 MG SL SUBL: 0.4000 mg | SUBLINGUAL_TABLET | SUBLINGUAL | Status: DC | PRN

## 2021-10-11 MED ORDER — IBUPROFEN 400 MG PO TABS
400.0000 mg | ORAL_TABLET | Freq: Four times a day (QID) | ORAL | Status: DC | PRN
  Administered 2021-10-12: 400 mg via ORAL
  Filled 2021-10-11: qty 1

## 2021-10-11 MED ORDER — NALOXONE HCL 2 MG/2ML IJ SOSY
0.4000 mg | PREFILLED_SYRINGE | Freq: Once | INTRAMUSCULAR | Status: AC
  Administered 2021-10-11: 0.4 mg via INTRAVENOUS
  Filled 2021-10-11: qty 2

## 2021-10-11 MED ORDER — SODIUM CHLORIDE 0.9 % IV BOLUS
1000.0000 mL | Freq: Once | INTRAVENOUS | Status: AC
  Administered 2021-10-11: 1000 mL via INTRAVENOUS

## 2021-10-11 NOTE — ED Triage Notes (Signed)
Pt comes ems from a boarding house with overdose. Heroin and oxy found in pocket. Pt admits to same. Did not have to have narcan. Awake to painful stimuli and loud voice. Pt able to answer questions and follow commands slowly. Pupils pinpoint. Has hx of same.

## 2021-10-11 NOTE — H&P (Signed)
History and Physical    Ramero Stankevich [id number] DOB: 12-12-1975 DOA: 10/11/2021  Referring MD/NP/PA:   PCP: Pcp, No   Patient coming from:  The patient is coming from home.    Chief Complaint: overdose and AMS  HPI: Cody Rojas is a 46 y.o. male with medical history significant of polysubstance abuse, tobacco abuse, who presents with overdose and AMS   Pt is very lethargic, history is limited. Per report, pt comes from a boarding house with possible overdose.  Per ED physician, patient admitted overdose Heroin, but his UDS is positive for amphetamine and cocaine in ED. Pt was found to have AMS and pinpoint pupils. Pt was given two dose of 0.4 mg of narcan with only slight improvement of his mental status.  When I saw patient in the ED, patient is very lethargic, still arousable, answers some questions very slowly.  He knows his own name, he is orientated to the place, but not orientated to time.  He moves all extremities.  Patient has cough with little mucus production.  Denies chest pain.  No respiratory distress noted.  No active nausea, vomiting, diarrhea, denies abdominal pain.  Data Reviewed and ED Course: pt was found to have positive UDS for amphetamine and cocaine, negative COVID PCR, CK 590, alcohol level 23, troponin level 10 --> 22 --> 24, GFR > 60, blood sugar 255 by BMP, temperature normal, blood pressure 153/86, heart rate 101, 95, RR 29, 16.  Chest x-ray showed bilateral basilar opacity.  CT of head is limited study, but it is negative for acute intracranial abnormalities, but showed possible right frontal scalp lipoma.  CT-head: A portion of the anteroinferior right temporal lobe is excluded from the field of view. Additionally, the cerebellar tonsils are incompletely included in the field of view.   Within described limitations, there is no evidence of acute intracranial abnormality.   1.6 cm lipoma within the right frontal scalp/forehead soft tissues with subtle  adjacent calvarial remodeling   EKG: I have personally reviewed.  Sinus rhythm, QTc 426, LAE, T wave inversion in lead III/aVF and V5-V6, early R wave progression   Review of Systems: Could not reviewed accurately due to altered mental status   Allergy: No Known Allergies  Past Medical History:  Diagnosis Date   Polysubstance abuse ([place])     Past Surgical History:  Procedure Laterality Date   scalp surgery      Social History:  reports that he has been smoking cigarettes. He has never used smokeless tobacco. He reports current alcohol use. He reports current drug use. Drugs: Cocaine and Amphetamines.  Family History:  Family History  Problem Relation Age of Onset   Diabetes Mother    Diabetes Father      Prior to Admission medications   Not on File    Physical Exam: Vitals:   10/11/21 1415 10/11/21 1427 10/11/21 1430 10/11/21 1509  BP: (!) 163/103  (!) 151/94 (!) 150/86  Pulse: 92  85 80  Resp: 16  11 15   Temp:  97.8 F (36.6 C)  97.6 F (36.4 C)  TempSrc:  Axillary    SpO2: 100%  100% 97%  Weight:      Height:       General: Not in acute distress.  Dry mucous membranes HEENT:       Eyes: PERRL, EOMI, no scleral icterus.       ENT: No discharge from the ears and nose       Neck: No  JVD, no bruit, no mass felt. Heme: No neck lymph node enlargement. Cardiac: S1/S2, RRR, No murmurs, No gallops or rubs. Respiratory: No rales, wheezing, rhonchi or rubs. GI: Soft, nondistended, nontender, no organomegaly, BS present. GU: No hematuria Ext: No pitting leg edema bilaterally. 1+DP/PT pulse bilaterally. Musculoskeletal: No joint deformities, No joint redness or warmth, no limitation of ROM in spin. Skin: No rashes.  Neuro: has altered mental status, very lethargic, arousable, partially following commands, answered some questions very slowly, knows his own name, oriented to the place, not oriented to time, cranial nerves II-XII grossly intact, moves all extremities    Psych: Patient is not psychotic, no suicidal or hemocidal ideation.  Labs on Admission: I have personally reviewed following labs and imaging studies  CBC: Recent Labs  Lab 10/11/21 0749  WBC 16.1*  HGB 15.4  HCT 48.4  MCV 98.2  PLT 0000000   Basic Metabolic Panel: Recent Labs  Lab 10/11/21 0749  NA 135  K 3.9  CL 103  CO2 23  GLUCOSE 255*  BUN 14  CREATININE 1.09  CALCIUM 8.4*   GFR: Estimated Creatinine Clearance: 109.1 mL/min (by C-G formula based on SCr of 1.09 mg/dL). Liver Function Tests: Recent Labs  Lab 10/11/21 0749  AST 61*  ALT 40  ALKPHOS 90  BILITOT 0.5  PROT 7.4  ALBUMIN 4.1   No results for input(s): LIPASE, AMYLASE in the last 168 hours. No results for input(s): AMMONIA in the last 168 hours. Coagulation Profile: No results for input(s): INR, PROTIME in the last 168 hours. Cardiac Enzymes: Recent Labs  Lab 10/11/21 1034  CKTOTAL 590*   BNP (last 3 results) No results for input(s): PROBNP in the last 8760 hours. HbA1C: Recent Labs    10/11/21 0749  HGBA1C 5.7*   CBG: Recent Labs  Lab 10/11/21 1330 10/11/21 1533  GLUCAP 98 91   Lipid Profile: No results for input(s): CHOL, HDL, LDLCALC, TRIG, CHOLHDL, LDLDIRECT in the last 72 hours. Thyroid Function Tests: No results for input(s): TSH, T4TOTAL, FREET4, T3FREE, THYROIDAB in the last 72 hours. Anemia Panel: No results for input(s): VITAMINB12, FOLATE, FERRITIN, TIBC, IRON, RETICCTPCT in the last 72 hours. Urine analysis: No results found for: COLORURINE, APPEARANCEUR, LABSPEC, PHURINE, GLUCOSEU, HGBUR, BILIRUBINUR, [place], [place], UROBILINOGEN, NITRITE, LEUKOCYTESUR Sepsis Labs: @LABRCNTIP (procalcitonin:4,lacticidven:4) ) Recent Results (from the past 240 hour(s))  Blood culture (routine x 2)     Status: None (Preliminary result)   Collection Time: 10/11/21  1:24 PM   Specimen: BLOOD  Result Value Ref Range Status   Specimen Description BLOOD RIGHT ANTECUBITAL  Final    Special Requests   Final    BOTTLES DRAWN AEROBIC AND ANAEROBIC Blood Culture adequate volume   Culture   Final    NO GROWTH < 12 HOURS Performed at [facility], [address] [place], [place] [address]    Report Status PENDING  Incomplete  Blood culture (routine x 2)     Status: None (Preliminary result)   Collection Time: 10/11/21  1:24 PM   Specimen: BLOOD  Result Value Ref Range Status   Specimen Description BLOOD BLOOD RIGHT ARM  Final   Special Requests   Final    BOTTLES DRAWN AEROBIC AND ANAEROBIC Blood Culture adequate volume   Culture   Final    NO GROWTH < 12 HOURS Performed at [facility], [address] [place], [place] [address]    Report Status PENDING  Incomplete  Resp Panel by RT-PCR (Flu A&B, Covid)  Nasopharyngeal Swab     Status: None   Collection Time: 10/11/21  1:24 PM   Specimen: Nasopharyngeal Swab; Nasopharyngeal(NP) swabs in vial transport medium  Result Value Ref Range Status   SARS Coronavirus 2 by RT PCR NEGATIVE NEGATIVE Final    Comment: (NOTE) SARS-CoV-2 target nucleic acids are NOT DETECTED.  The SARS-CoV-2 RNA is generally detectable in upper respiratory specimens during the acute phase of infection. The lowest concentration of SARS-CoV-2 viral copies this assay can detect is 138 copies/mL. A negative result does not preclude SARS-Cov-2 infection and should not be used as the sole basis for treatment or other patient management decisions. A negative result may occur with  improper specimen collection/handling, submission of specimen other than nasopharyngeal swab, presence of viral mutation(s) within the areas targeted by this assay, and inadequate number of viral copies(<138 copies/mL). A negative result must be combined with clinical observations, patient history, and epidemiological information. The expected result is Negative.  Fact Sheet for Patients:  EntrepreneurPulse.com.au  Fact Sheet for  Healthcare Providers:  IncredibleEmployment.be  This test is no t yet approved or cleared by the [place] FDA and  has been authorized for detection and/or diagnosis of SARS-CoV-2 by FDA under an Emergency Use Authorization (EUA). This EUA will remain  in effect (meaning this test can be used) for the duration of the COVID-19 declaration under Section 564(b)(1) of the Act, 21 U.S.C.section 360bbb-3(b)(1), unless the authorization is terminated  or revoked sooner.       Influenza A by PCR NEGATIVE NEGATIVE Final   Influenza B by PCR NEGATIVE NEGATIVE Final    Comment: (NOTE) The Xpert Xpress SARS-CoV-2/FLU/RSV plus assay is intended as an aid in the diagnosis of influenza from Nasopharyngeal swab specimens and should not be used as a sole basis for treatment. Nasal washings and aspirates are unacceptable for Xpert Xpress SARS-CoV-2/FLU/RSV testing.  Fact Sheet for Patients: EntrepreneurPulse.com.au  Fact Sheet for Healthcare Providers: IncredibleEmployment.be  This test is not yet approved or cleared by the [place] FDA and has been authorized for detection and/or diagnosis of SARS-CoV-2 by FDA under an Emergency Use Authorization (EUA). This EUA will remain in effect (meaning this test can be used) for the duration of the COVID-19 declaration under Section 564(b)(1) of the Act, 21 U.S.C. section 360bbb-3(b)(1), unless the authorization is terminated or revoked.  Performed at [facility], [address] [place], [place] [address]      Radiological Exams on Admission: CT HEAD WO CONTRAST (5MM)  Result Date: 10/11/2021 CLINICAL DATA:  Mental status change, unknown cause. EXAM: CT HEAD WITHOUT CONTRAST TECHNIQUE: Contiguous axial images were obtained from the base of the skull through the vertex without intravenous contrast. RADIATION DOSE REDUCTION: This exam was performed according to the departmental  dose-optimization program which includes automated exposure control, adjustment of the mA and/or kV according to patient size and/or use of iterative reconstruction technique. COMPARISON:  No pertinent prior exams available for comparison. FINDINGS: Brain: A portion of the anteroinferior right temporal lobe is excluded from the field of view. Additionally, the cerebellar tonsils are incompletely included in the field of view. Cerebral volume is normal. There is no acute intracranial hemorrhage. No demarcated cortical infarct. No extra-axial fluid collection. No evidence of an intracranial mass. No midline shift. Vascular: No hyperdense vessel. Skull: Normal. Negative for fracture or focal lesion. Sinuses/Orbits: The orbits are minimally included in the field of view. No acute orbital finding at the imaged levels. No significant paranasal sinus  disease at the imaged levels. Other: 1.6 cm fat density lesion within the right frontal scalp/forehead compatible with lipoma. Subtle adjacent calvarial remodeling. IMPRESSION: A portion of the anteroinferior right temporal lobe is excluded from the field of view. Additionally, the cerebellar tonsils are incompletely included in the field of view. Within described limitations, there is no evidence of acute intracranial abnormality. 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 Electronically Signed   By: Kellie Simmering D.O.   On: 10/11/2021 10:54   DG Chest Portable 1 View  Result Date: 10/11/2021 CLINICAL DATA:  46 year old male with history of drug overdose. EXAM: PORTABLE CHEST 1 VIEW COMPARISON:  No priors. FINDINGS: Lung volumes are low. Ill-defined bibasilar opacities are noted (left-greater-than-right). Blunting of the left costophrenic sulcus. No pleural effusions. No pneumothorax. No pulmonary nodule or mass noted. Pulmonary vasculature and the cardiomediastinal silhouette are within normal limits.  IMPRESSION: 1. Low lung volumes with bibasilar areas of atelectasis and/or consolidation, with small left pleural effusion. Given the patient's history of drug overdose, clinical correlation for signs and symptoms of aspiration is recommended. Electronically Signed   By: Vinnie Langton M.D.   On: 10/11/2021 08:24      Assessment/Plan Principal Problem:   Acute metabolic encephalopathy Active Problems:   Polysubstance abuse (HCC)   Aspiration pneumonia (HCC)   Elevated troponin   Hyperglycemia   Rhabdomyolysis   Tobacco abuse   Assessment and Plan: * Acute metabolic encephalopathy- (present on admission) Most likely due to polysubstance abuse.  UDS supportive for further medical care.  CT head is negative for acute intracranial abnormalities though CT of head is a limited study. -Frequent neurochecks -Patient received 2 doses of Narcan 0.4 mg x 2, with slight improvement of mental status. -IV fluid  Aspiration pneumonia ([place])- (present on admission) WBC 16.1, has productive cough, chest x-ray showed bilateral basilar opacity, indicating possible aspiration pneumonia. -Start Unasyn -As needed albuterol and Mucinex -Sputum culture  Polysubstance abuse ([place])- (present on admission) Needed counseling with mental status improves  Elevated troponin- (present on admission) Troponin level times 10 --> 22 --> 24.  Likely due to demand ischemia and cocaine abuse -Aspirin 81 mg daily -As needed nitroglycerin -Trend troponin -Check A1c, FLP  Hyperglycemia- (present on admission) Blood sugar 255.  No history of diabetes medical problem list, no A1c available. -Check A1c -Sliding scale insulin  Rhabdomyolysis- (present on admission) Mild rhabdomyolysis, CK 590 -IV fluid: 2 L normal saline, followed by 25 cc/h -Repeat CK in morning  Tobacco abuse -Nicotine patch       DVT ppx: SQ Lovenox  Code Status: Full code  Family Communication: not done, no family member is at bed  side.  No contact information is available in epic  Disposition Plan:  Anticipate discharge back to previous environment  Consults called:  None  Admission status and  Level of care: Telemetry Medical:  for obs      Severity of Illness:  The appropriate patient status for this patient is OBSERVATION. Observation status is judged to be reasonable and necessary in order to provide the required intensity of service to ensure the patient's safety. The patient's presenting symptoms, physical exam findings, and initial radiographic and laboratory data in the context of their medical condition is felt to place them at decreased risk for further clinical deterioration. Furthermore, it is anticipated that the patient will be medically stable for discharge from the hospital within 2 midnights of admission.  Date of Service 10/11/2021    Ivor [facility]   If 7PM-7AM, please contact night-coverage [email or web address] 10/11/2021, 4:05 PM

## 2021-10-11 NOTE — ED Notes (Signed)
Pt would not keep arm still for IV/blood culture draw.

## 2021-10-11 NOTE — ED Notes (Signed)
Taken to CT again with Jonny Ruiz, RN

## 2021-10-11 NOTE — ED Provider Notes (Signed)
[facility] Provider Note    Event Date/Time   First MD Initiated Contact with Patient 10/11/21 [phone]     (approximate)   History   Drug Overdose  Level V Caveat:  AMS - overdose  HPI  Cody Rojas is a 46 y.o. male with no documented significant past medical history presents to the ER via EMS after reported overdose.  Patient admits to using heroin.  On arrival patient denies any complaints but very sleepy drowsy.  He is protecting his airway.  Does admit to some snorting heroin this morning.  States it was recreational use.     Physical Exam   Triage Vital Signs: ED Triage Vitals  Enc Vitals Group     BP 10/11/21 0736 (!) 126/91     Pulse Rate 10/11/21 0736 (!) 101     Resp 10/11/21 0736 14     Temp 10/11/21 0736 97.7 F (36.5 C)     Temp Source 10/11/21 0736 Axillary     SpO2 10/11/21 0736 100 %     Weight 10/11/21 0732 240 lb (108.9 kg)     Height 10/11/21 0732 6' (1.829 m)     Head Circumference --      Peak Flow --      Pain Score 10/11/21 0732 0     Pain Loc --      Pain Edu? --      Excl. in GC? --     Most recent vital signs: Vitals:   10/11/21 1200 10/11/21 1215  BP: 135/80 (!) 153/86  Pulse: 91 95  Resp: 20 16  Temp:    SpO2: 100% 97%     Constitutional: drowsy Eyes: Conjunctivae are normal.  Head: Atraumatic. Nose: No congestion/rhinnorhea. Mouth/Throat: Mucous membranes are moist.   Neck: Painless ROM.  Cardiovascular:   Good peripheral circulation. Respiratory: Normal respiratory effort.  No retractions.  Gastrointestinal: Soft and nontender.  Musculoskeletal:  no deformity Neurologic:  MAE spontaneously. No gross focal neurologic deficits are appreciated.  Skin:  Skin is warm, dry and intact. No rash noted. Psychiatric: Mood and affect are normal. Speech and behavior are normal.    ED Results / Procedures / Treatments   Labs (all labs ordered are listed, but only abnormal results are displayed) Labs  Reviewed  CBC - Abnormal; Notable for the following components:      Result Value   WBC 16.1 (*)    All other components within normal limits  COMPREHENSIVE METABOLIC PANEL - Abnormal; Notable for the following components:   Glucose, Bld 255 (*)    Calcium 8.4 (*)    AST 61 (*)    All other components within normal limits  URINE DRUG SCREEN, QUALITATIVE ([facility] ONLY) - Abnormal; Notable for the following components:   Amphetamines, Ur Screen POSITIVE (*)    Cocaine Metabolite,Ur  POSITIVE (*)    All other components within normal limits  ETHANOL - Abnormal; Notable for the following components:   Alcohol, Ethyl (B) 23 (*)    All other components within normal limits  CK - Abnormal; Notable for the following components:   Total CK 590 (*)    All other components within normal limits  TROPONIN I (HIGH SENSITIVITY) - Abnormal; Notable for the following components:   Troponin I (High Sensitivity) 22 (*)    All other components within normal limits  CULTURE, BLOOD (ROUTINE X 2)  CULTURE, BLOOD (ROUTINE X 2)  EXPECTORATED SPUTUM ASSESSMENT W GRAM STAIN, RFLX  TO RESP C  RESP PANEL BY RT-PCR (FLU A&B, COVID) ARPGX2  MRSA NEXT GEN BY PCR, NASAL  HEMOGLOBIN A1C  LACTIC ACID, PLASMA  LACTIC ACID, PLASMA  PROCALCITONIN  HIV ANTIBODY (ROUTINE TESTING W REFLEX)  CBG MONITORING, ED  TROPONIN I (HIGH SENSITIVITY)  TROPONIN I (HIGH SENSITIVITY)     EKG  ED ECG REPORT I, Willy Eddy, the attending physician, personally viewed and interpreted this ECG.   Date: 10/11/2021  EKG Time: 7:35  Rate: 100  Rhythm: sinus  Axis: normal  Intervals:normal qt  ST&T Change: concave upwards st elevation in 1 and aVL  looks like j point elevation with lateral t-wave inversion  ED ECG REPORT I, Willy Eddy, the attending physician, personally viewed and interpreted this ECG.   Date: 10/11/2021  EKG Time: 7:50  Rate: 100  Rhythm: sinus  Axis: normal  Intervals:normal qt  ST&T  Change: concave upwards st elevation in 1 and aVL  looks like j point elevation with lateral t-wave inversion. Unchanged compared to previous    RADIOLOGY Please see ED Course for my review and interpretation.  I personally reviewed all radiographic images ordered to evaluate for the above acute complaints and reviewed radiology reports and findings.  These findings were personally discussed with the patient.  Please see medical record for radiology report.    PROCEDURES:  Critical Care performed: No  Procedures   MEDICATIONS ORDERED IN ED: Medications  aspirin EC tablet 81 mg (has no administration in time range)  0.9 %  sodium chloride infusion ( Intravenous New Bag/Given 10/11/21 1322)  ondansetron (ZOFRAN) injection 4 mg (has no administration in time range)  ibuprofen (ADVIL) tablet 400 mg (has no administration in time range)  insulin aspart (novoLOG) injection 0-9 Units (0 Units Subcutaneous Not Given 10/11/21 1331)  nitroGLYCERIN (NITROSTAT) SL tablet 0.4 mg (has no administration in time range)  albuterol (PROVENTIL) (2.5 MG/3ML) 0.083% nebulizer solution 2.5 mg (has no administration in time range)  dextromethorphan-guaiFENesin (MUCINEX DM) 30-600 MG per 12 hr tablet 1 tablet (has no administration in time range)  enoxaparin (LOVENOX) injection 55 mg (has no administration in time range)  Ampicillin-Sulbactam (UNASYN) 3 g in sodium chloride 0.9 % 100 mL IVPB (has no administration in time range)  naloxone [facility]) injection 0.4 mg (0.4 mg Intravenous Given 10/11/21 0747)  ondansetron (ZOFRAN) injection 2 mg (2 mg Intravenous Given 10/11/21 0754)  naloxone [facility]) injection 0.4 mg (0.4 mg Intravenous Given 10/11/21 0816)  naloxone [facility]) injection 0.4 mg (0.4 mg Intravenous Given 10/11/21 0841)  sodium chloride 0.9 % bolus 1,000 mL (1,000 mLs Intravenous New Bag/Given 10/11/21 1248)  sodium chloride 0.9 % bolus 1,000 mL (1,000 mLs Intravenous New Bag/Given 10/11/21 1322)      IMPRESSION / MDM / ASSESSMENT AND PLAN / ED COURSE  I reviewed the triage vital signs and the nursing notes.                              Differential diagnosis includes, but is not limited to, Dehydration, sepsis, pna, uti, hypoglycemia, cva, drug effect, withdrawal, encephalitis   With reported overdose on heroin this morning.  Patient unable to provide much additional history as she does appear under the influence.  Patient given Narcan he is not dyspneic pupils are 1 mm.  No hypotension no hypoxia at this time.  Buttocks and the above differential order imaging.   Clinical Course as of 10/11/21 1332  Wed  Oct 11, 2021  0807 Patient still very drowsy.  Will give additional dose of Narcan.  His repeat EKG is without any changes.  Have a lower suspicion for MI given history of narcotic overdose as his presentation but will continue to monitor order serial enzymes. [PR]  [phone] Patient still drowsy.  Not providing much additional history.  Unable to find contact to get additional history. [PR]  0841 Mild leukocytosis.  Troponin negative.  Noted hyperglycemia but no anion gap or acidosis. [PR]  [phone] CT imaging was ordered with patient awaken enough after third Narcan dose to state that he was refusing CT imaging discussed the importance of CT the patient is still somewhat drowsy.  Will reassess. [PR]  1004 Ethanol is mildly elevated [PR]  1031 Patient now much more alert try to get out of bed.  Does seem consistent with heroin overdose though he has not provided UDS. [PR]  1114 CT imaging by my review does not show any evidence of mass or subdural.  Patient UDS did test positive for amphetamines and cocaine. [PR]  1159 Patient up ambulating to the bathroom.  On review of previous chart and patient did have admission for substance abuse overdose and rhabdo.  His EKG does appear changes compared to 2017 but patient denies any chest pain or shortness of breath at this time.  Mild elevation of  troponin.  Will check CK as well as repeat troponin. [PR]  1228 Patient CK is mildly elevated will give additional IV fluids.  Having productive sounding cough is not hypoxic but given leukocytosis and x-ray concerning for aspiration will order Unasyn.  Given toxic encephalopathy with elevated CK mildly elevated troponin with abnormal EKG Will discuss with hospitalist for admission. [PR]  1330 This case discussed in consultation with hospitalist who agrees to admit patient to their service [PR]    Clinical Course User Index [PR] Willy Eddy, MD     FINAL CLINICAL IMPRESSION(S) / ED DIAGNOSES   Final diagnoses:  Aspiration pneumonia of right lower lobe due to gastric secretions [facility])  Overdose of undetermined intent, initial encounter     Rx / DC Orders   ED Discharge Orders     None        Note:  This document was prepared using Dragon voice recognition software and may include unintentional dictation errors.    Willy Eddy, MD 10/11/21 1332

## 2021-10-11 NOTE — Progress Notes (Signed)
Patient woke up and notified RN he wanted to go home.  Patient educated why admitted. Patient adamantly wanting to go home.  MD notified and states patient will have to leave AMA.  Patient notified and states will stay currently.  Patient notified his family not to come pick him up at this time.

## 2021-10-11 NOTE — Progress Notes (Signed)
MD notified of critical value from lab for lactic acid 2.2.

## 2021-10-11 NOTE — ED Notes (Signed)
EDP at [facility] speaking with pt. Pt sleepy, interactive.

## 2021-10-11 NOTE — ED Notes (Signed)
Pt denies any chest pain. EKG given to EDP. Pt very diaphoretic. Remains alert to loud or painful stimuli.

## 2021-10-11 NOTE — Assessment & Plan Note (Signed)
Troponin level times 10 --> 22 --> 24.  Likely due to demand ischemia and cocaine abuse -Aspirin 81 mg daily -As needed nitroglycerin -Trend troponin -Check A1c, FLP

## 2021-10-11 NOTE — Assessment & Plan Note (Signed)
Needed counseling with mental status improves

## 2021-10-11 NOTE — ED Notes (Signed)
Pt able to walk to bathroom. Has slight limp but able to do so independently.

## 2021-10-11 NOTE — Progress Notes (Signed)
PHARMACIST - PHYSICIAN COMMUNICATION  CONCERNING:  Enoxaparin (Lovenox) for DVT Prophylaxis    RECOMMENDATION: Patient was prescribed enoxaparin 40mg  q24 hours for VTE prophylaxis.   Filed Weights   10/11/21 0732  Weight: 108.9 kg (240 lb)    Body mass index is 32.55 kg/m.  Estimated Creatinine Clearance: 109.1 mL/min (by C-G formula based on SCr of 1.09 mg/dL).   Based on [employer] policy patient is candidate for enoxaparin 0.5mg /kg TBW SQ every 24 hours based on BMI being >30.  DESCRIPTION: Pharmacy has adjusted enoxaparin dose per [facility] policy.  Patient is now receiving enoxaparin 55 mg every 24 hours   [facility] 10/11/2021 1:00 PM

## 2021-10-11 NOTE — Assessment & Plan Note (Signed)
-  Nicotine patch 

## 2021-10-11 NOTE — ED Notes (Signed)
Pt refusing CT per CT tech. EDP informed.

## 2021-10-11 NOTE — Assessment & Plan Note (Signed)
Blood sugar 255.  No history of diabetes medical problem list, no A1c available. -Check A1c -Sliding scale insulin

## 2021-10-11 NOTE — ED Notes (Signed)
Sleeping, no change.

## 2021-10-11 NOTE — Assessment & Plan Note (Signed)
WBC 16.1, has productive cough, chest x-ray showed bilateral basilar opacity, indicating possible aspiration pneumonia. -Start Unasyn -As needed albuterol and Mucinex -Sputum culture

## 2021-10-11 NOTE — Consult Note (Signed)
Pharmacy Antibiotic Note  Cody Rojas is a 46 y.o. male with medical history including polysubstance abuse admitted on 10/11/2021 with  opioid overdose . Chest radiograph concerning for aspiration. Patient is on room air. With leukocytosis but afebrile. Pharmacy has been consulted for Unasyn dosing.  Plan:  Unasyn 3 g IV q6h  Height: 6' (182.9 cm) Weight: 108.9 kg (240 lb) IBW/kg (Calculated) : 77.6  Temp (24hrs), Avg:97.7 F (36.5 C), Min:97.7 F (36.5 C), Max:97.7 F (36.5 C)  Recent Labs  Lab 10/11/21 0749  WBC 16.1*  CREATININE 1.09    Estimated Creatinine Clearance: 109.1 mL/min (by C-G formula based on SCr of 1.09 mg/dL).    No Known Allergies  Antimicrobials this admission: Unasyn 2/8 >>   Dose adjustments this admission: N/A  Microbiology results: 2/8 BCx: pending 2/8 Sputum: pending  2/8 MRSA PCR: pending  Thank you for allowing pharmacy to be a part of this patients care.  Tressie Ellis 10/11/2021 1:18 PM

## 2021-10-11 NOTE — Assessment & Plan Note (Signed)
Mild rhabdomyolysis, CK 590 -IV fluid: 2 L normal saline, followed by 25 cc/h -Repeat CK in morning

## 2021-10-11 NOTE — Progress Notes (Signed)
Patient arrived to floor able to independently scoot to bed and then returned to sleep.  Patient will wake when touched or spoken.  Unable to answer questions. Will continue to monitor.  Placed on telemetry and started IVF

## 2021-10-11 NOTE — ED Notes (Signed)
Taken to CT.

## 2021-10-11 NOTE — ED Notes (Addendum)
Pt sleeping, sonorous resps, arousable to voice, cooperative, follows commands, confused, pCXR complete. VSS

## 2021-10-11 NOTE — Assessment & Plan Note (Signed)
Most likely due to polysubstance abuse.  UDS supportive for further medical care.  CT head is negative for acute intracranial abnormalities though CT of head is a limited study. -Frequent neurochecks -Patient received 2 doses of Narcan 0.4 mg x 2, with slight improvement of mental status. -IV fluid

## 2021-10-12 ENCOUNTER — Other Ambulatory Visit: Payer: Self-pay

## 2021-10-12 DIAGNOSIS — G9341 Metabolic encephalopathy: Secondary | ICD-10-CM

## 2021-10-12 LAB — CBC
HCT: 50.8 % (ref 39.0–52.0)
Hemoglobin: 16.5 g/dL (ref 13.0–17.0)
MCH: 31.5 pg (ref 26.0–34.0)
MCHC: 32.5 g/dL (ref 30.0–36.0)
MCV: 97.1 fL (ref 80.0–100.0)
Platelets: 268 10*3/uL (ref 150–400)
RBC: 5.23 MIL/uL (ref 4.22–5.81)
RDW: 14.8 % (ref 11.5–15.5)
WBC: 11.5 10*3/uL — ABNORMAL HIGH (ref 4.0–10.5)
nRBC: 0 % (ref 0.0–0.2)

## 2021-10-12 LAB — LIPID PANEL
Cholesterol: 206 mg/dL — ABNORMAL HIGH (ref 0–200)
HDL: 92 mg/dL (ref >40)
LDL Cholesterol: 101 mg/dL — ABNORMAL HIGH (ref 0–99)
Total CHOL/HDL Ratio: 2.2 RATIO
Triglycerides: 63 mg/dL (ref <150)
VLDL: 13 mg/dL (ref 0–40)

## 2021-10-12 LAB — GLUCOSE, CAPILLARY
Glucose-Capillary: 125 mg/dL — ABNORMAL HIGH (ref 70–99)
Glucose-Capillary: 181 mg/dL — ABNORMAL HIGH (ref 70–99)

## 2021-10-12 LAB — CK: Total CK: 311 U/L (ref 49–397)

## 2021-10-12 MED ORDER — AMOXICILLIN-POT CLAVULANATE 875-125 MG PO TABS
1.0000 | ORAL_TABLET | Freq: Two times a day (BID) | ORAL | 0 refills | Status: AC
  Filled 2021-10-12: qty 14, 7d supply, fill #0

## 2021-10-12 NOTE — Discharge Summary (Signed)
Physician Discharge Summary  Cody Rojas ZOX:[id number] DOB: 03/10/1976 DOA: 10/11/2021  PCP: Pcp, No  Admit date: 10/11/2021 Discharge date: 10/12/2021  Admitted From: Home Disposition: Home  Recommendations for Outpatient Follow-up:  Follow up with PCP in 1-2 weeks   Home Health: No Equipment/Devices: None  Discharge Condition: Stable CODE STATUS: Full Diet recommendation: Regular  Brief/Interim Summary: 46 y.o. male with medical history significant of polysubstance abuse, tobacco abuse, who presents with overdose and AMS   Pt is very lethargic, history is limited. Per report, pt comes from a boarding house with possible overdose.  Per ED physician, patient admitted overdose Heroin, but his UDS is positive for amphetamine and cocaine in ED. Pt was found to have AMS and pinpoint pupils. Pt was given two dose of 0.4 mg of narcan with only slight improvement of his mental status.  When I saw patient in the ED, patient is very lethargic, still arousable, answers some questions very slowly.  He knows his own name, he is orientated to the place, but not orientated to time.  He moves all extremities.  Patient has cough with little mucus production.  Denies chest pain.  No respiratory distress noted.  No active nausea, vomiting, diarrhea, denies abdominal pain.  Maintain on IV fluids.  Asymptomatic at time of discharge.  Back to baseline level of mentation.  Educated on the importance of drug cessation.  TOC consult obtained for substance abuse resources.  At time of discharge cannot rule out entirely aspiration pneumonia.  As such we will treat empirically with course of antibiotics.  Discharge Diagnoses:  Principal Problem:   Acute metabolic encephalopathy Active Problems:   Polysubstance abuse (HCC)   Rhabdomyolysis   Elevated troponin   Aspiration pneumonia (HCC)   Hyperglycemia   Tobacco abuse  Acute metabolic encephalopathy Overdose Substance abuse Patient presented after lethargy  and decreased level of mentation noted.  Urine drug screen positive for cocaine and amphetamines.  Patient endorses only heroin use.  Feels he may have been given some other drugs without his knowledge.  Back to baseline level of mentation at time of discharge.  They received 2 doses Narcan 0.4 mg in ED with limited improvement.  Was maintained on IV fluids of course of admission.  Educated on drug cessation at time of discharge.  Possible aspiration pneumonia Elevated WBC with productive cough and bilateral basilar opacity on chest x-ray.  Unable to rule out aspiration pneumonia.  Unasyn in house.  Transition to Augmentin at time of discharge.  Discharge Instructions  Discharge Instructions     Diet - low sodium heart healthy   Complete by: As directed    Increase activity slowly   Complete by: As directed       Allergies as of 10/12/2021   No Known Allergies      Medication List     TAKE these medications    amoxicillin-clavulanate 875-125 MG tablet Commonly known as: Augmentin Take 1 tablet by mouth 2 (two) times daily for 7 days.        No Known Allergies  Consultations: None   Procedures/Studies: CT HEAD WO CONTRAST ( )  Result Date: 10/11/2021 CLINICAL DATA:  Mental status change, unknown cause. EXAM: CT HEAD WITHOUT CONTRAST TECHNIQUE: Contiguous axial images were obtained from the base of the skull through the vertex without intravenous contrast. RADIATION DOSE REDUCTION: This exam was performed according to the departmental dose-optimization program which includes automated exposure control, adjustment of the mA and/or kV according to patient size and/or  use of iterative reconstruction technique. COMPARISON:  No pertinent prior exams available for comparison. FINDINGS: Brain: A portion of the anteroinferior right temporal lobe is excluded from the field of view. Additionally, the cerebellar tonsils are incompletely included in the field of view. Cerebral volume is  normal. There is no acute intracranial hemorrhage. No demarcated cortical infarct. No extra-axial fluid collection. No evidence of an intracranial mass. No midline shift. Vascular: No hyperdense vessel. Skull: Normal. Negative for fracture or focal lesion. Sinuses/Orbits: The orbits are minimally included in the field of view. No acute orbital finding at the imaged levels. No significant paranasal sinus disease at the imaged levels. Other: 1.6 cm fat density lesion within the right frontal scalp/forehead compatible with lipoma. Subtle adjacent calvarial remodeling. IMPRESSION: A portion of the anteroinferior right temporal lobe is excluded from the field of view. Additionally, the cerebellar tonsils are incompletely included in the field of view. Within described limitations, there is no evidence of acute intracranial abnormality. 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 Electronically Signed   By: Jackey LogeKyle  Golden D.O.   On: 10/11/2021 10:54   DG Chest Portable 1 View  Result Date: 10/11/2021 CLINICAL DATA:  46 year old male with history of drug overdose. EXAM: PORTABLE CHEST 1 VIEW COMPARISON:  No priors. FINDINGS: Lung volumes are low. Ill-defined bibasilar opacities are noted (left-greater-than-right). Blunting of the left costophrenic sulcus. No pleural effusions. No pneumothorax. No pulmonary nodule or mass noted. Pulmonary vasculature and the cardiomediastinal silhouette are within normal limits. IMPRESSION: 1. Low lung volumes with bibasilar areas of atelectasis and/or consolidation, with small left pleural effusion. Given the patient's history of drug overdose, clinical correlation for signs and symptoms of aspiration is recommended. Electronically Signed   By: Trudie Reedaniel  Entrikin M.D.   On: 10/11/2021 08:24      Subjective: Seen and examined the day of discharge.  Stable no distress.  Has Eliquis appropriately.  Encephalopathy resolved.   Stable for discharge home.  Discharge Exam: Vitals:   10/12/21 0330 10/12/21 0811  BP: 125/82 130/89  Pulse: 93 94  Resp: 18 17  Temp: 98.1 F (36.7 C) 97.9 F (36.6 C)  SpO2: 96% 97%   Vitals:   10/11/21 1509 10/11/21 2004 10/12/21 0330 10/12/21 0811  BP: (!) 150/86 132/85 125/82 130/89  Pulse: 80 (!) 105 93 94  Resp: 15 18 18 17   Temp: 97.6 F (36.4 C) 98.3 F (36.8 C) 98.1 F (36.7 C) 97.9 F (36.6 C)  TempSrc:  Oral Oral Oral  SpO2: 97% 94% 96% 97%  Weight:      Height:        General: Pt is alert, awake, not in acute distress Cardiovascular: RRR, S1/S2 +, no rubs, no gallops Respiratory: CTA bilaterally, no wheezing, no rhonchi Abdominal: Soft, NT, ND, bowel sounds + Extremities: no edema, no cyanosis    The results of significant diagnostics from this hospitalization (including imaging, microbiology, ancillary and laboratory) are listed below for reference.     Microbiology: Recent Results (from the past 240 hour(s))  Blood culture (routine x 2)     Status: None (Preliminary result)   Collection Time: 10/11/21  1:24 PM   Specimen: BLOOD  Result Value Ref Range Status   Specimen Description BLOOD RIGHT ANTECUBITAL  Final   Special Requests   Final    BOTTLES DRAWN AEROBIC AND ANAEROBIC Blood Culture adequate volume   Culture   Final    NO GROWTH < 24  HOURS Performed at [facility], [address] [place], [place] [address]    Report Status PENDING  Incomplete  Blood culture (routine x 2)     Status: None (Preliminary result)   Collection Time: 10/11/21  1:24 PM   Specimen: BLOOD  Result Value Ref Range Status   Specimen Description BLOOD BLOOD RIGHT ARM  Final   Special Requests   Final    BOTTLES DRAWN AEROBIC AND ANAEROBIC Blood Culture adequate volume   Culture   Final    NO GROWTH < 24 HOURS Performed at [facility], [address] [place], [place] [address]    Report Status PENDING  Incomplete  Resp Panel by RT-PCR  (Flu A&B, Covid) Nasopharyngeal Swab     Status: None   Collection Time: 10/11/21  1:24 PM   Specimen: Nasopharyngeal Swab; Nasopharyngeal(NP) swabs in vial transport medium  Result Value Ref Range Status   SARS Coronavirus 2 by RT PCR NEGATIVE NEGATIVE Final    Comment: (NOTE) SARS-CoV-2 target nucleic acids are NOT DETECTED.  The SARS-CoV-2 RNA is generally detectable in upper respiratory specimens during the acute phase of infection. The lowest concentration of SARS-CoV-2 viral copies this assay can detect is 138 copies/mL. A negative result does not preclude SARS-Cov-2 infection and should not be used as the sole basis for treatment or other patient management decisions. A negative result may occur with  improper specimen collection/handling, submission of specimen other than nasopharyngeal swab, presence of viral mutation(s) within the areas targeted by this assay, and inadequate number of viral copies(<138 copies/mL). A negative result must be combined with clinical observations, patient history, and epidemiological information. The expected result is Negative.  Fact Sheet for Patients:  BloggerCourse.com  Fact Sheet for Healthcare Providers:  SeriousBroker.it  This test is no t yet approved or cleared by the [place] FDA and  has been authorized for detection and/or diagnosis of SARS-CoV-2 by FDA under an Emergency Use Authorization (EUA). This EUA will remain  in effect (meaning this test can be used) for the duration of the COVID-19 declaration under Section 564(b)(1) of the Act, 21 U.S.C.section 360bbb-3(b)(1), unless the authorization is terminated  or revoked sooner.       Influenza A by PCR NEGATIVE NEGATIVE Final   Influenza B by PCR NEGATIVE NEGATIVE Final    Comment: (NOTE) The Xpert Xpress SARS-CoV-2/FLU/RSV plus assay is intended as an aid in the diagnosis of influenza from Nasopharyngeal swab specimens  and should not be used as a sole basis for treatment. Nasal washings and aspirates are unacceptable for Xpert Xpress SARS-CoV-2/FLU/RSV testing.  Fact Sheet for Patients: BloggerCourse.com  Fact Sheet for Healthcare Providers: SeriousBroker.it  This test is not yet approved or cleared by the [place] FDA and has been authorized for detection and/or diagnosis of SARS-CoV-2 by FDA under an Emergency Use Authorization (EUA). This EUA will remain in effect (meaning this test can be used) for the duration of the COVID-19 declaration under Section 564(b)(1) of the Act, 21 U.S.C. section 360bbb-3(b)(1), unless the authorization is terminated or revoked.  Performed at [facility], [address] [place], [place] [address]      Labs: BNP (last 3 results) No results for input(s): BNP in the last 8760 hours. Basic Metabolic Panel: Recent Labs  Lab 10/11/21 0749  NA 135  K 3.9  CL 103  CO2 23  GLUCOSE 255*  BUN 14  CREATININE 1.09  CALCIUM 8.4*   Liver Function Tests: Recent  Labs  Lab 10/11/21 0749  AST 61*  ALT 40  ALKPHOS 90  BILITOT 0.5  PROT 7.4  ALBUMIN 4.1   No results for input(s): LIPASE, AMYLASE in the last 168 hours. No results for input(s): AMMONIA in the last 168 hours. CBC: Recent Labs  Lab 10/11/21 0749 10/12/21 0400  WBC 16.1* 11.5*  HGB 15.4 16.5  HCT 48.4 50.8  MCV 98.2 97.1  PLT 254 268   Cardiac Enzymes: Recent Labs  Lab 10/11/21 1034 10/12/21 0400  CKTOTAL 590* 311   BNP: Invalid input(s): POCBNP CBG: Recent Labs  Lab 10/11/21 1330 10/11/21 1533 10/11/21 2237 10/12/21 0047 10/12/21 0435  GLUCAP 98 91 120* 181* 125*   D-Dimer No results for input(s): DDIMER in the last 72 hours. Hgb A1c Recent Labs    10/11/21 0749  HGBA1C 5.7*   Lipid Profile Recent Labs    10/12/21 0400  CHOL 206*  HDL 92  LDLCALC 101*  TRIG 63  CHOLHDL 2.2   Thyroid function  studies No results for input(s): TSH, T4TOTAL, T3FREE, THYROIDAB in the last 72 hours.  Invalid input(s): FREET3 Anemia work up No results for input(s): VITAMINB12, FOLATE, FERRITIN, TIBC, IRON, RETICCTPCT in the last 72 hours. Urinalysis No results found for: COLORURINE, APPEARANCEUR, LABSPEC, PHURINE, GLUCOSEU, HGBUR, BILIRUBINUR, KETONESUR, PROTEINUR, UROBILINOGEN, NITRITE, LEUKOCYTESUR Sepsis Labs Invalid input(s): PROCALCITONIN,  WBC,  LACTICIDVEN Microbiology Recent Results (from the past 240 hour(s))  Blood culture (routine x 2)     Status: None (Preliminary result)   Collection Time: 10/11/21  1:24 PM   Specimen: BLOOD  Result Value Ref Range Status   Specimen Description BLOOD RIGHT ANTECUBITAL  Final   Special Requests   Final    BOTTLES DRAWN AEROBIC AND ANAEROBIC Blood Culture adequate volume   Culture   Final    NO GROWTH < 24 HOURS Performed at [facility], [address] [place], [place] [address]    Report Status PENDING  Incomplete  Blood culture (routine x 2)     Status: None (Preliminary result)   Collection Time: 10/11/21  1:24 PM   Specimen: BLOOD  Result Value Ref Range Status   Specimen Description BLOOD BLOOD RIGHT ARM  Final   Special Requests   Final    BOTTLES DRAWN AEROBIC AND ANAEROBIC Blood Culture adequate volume   Culture   Final    NO GROWTH < 24 HOURS Performed at [facility], [address] [place], [place] [address]    Report Status PENDING  Incomplete  Resp Panel by RT-PCR (Flu A&B, Covid) Nasopharyngeal Swab     Status: None   Collection Time: 10/11/21  1:24 PM   Specimen: Nasopharyngeal Swab; Nasopharyngeal(NP) swabs in vial transport medium  Result Value Ref Range Status   SARS Coronavirus 2 by RT PCR NEGATIVE NEGATIVE Final    Comment: (NOTE) SARS-CoV-2 target nucleic acids are NOT DETECTED.  The SARS-CoV-2 RNA is generally detectable in upper respiratory specimens during the acute phase of infection. The  lowest concentration of SARS-CoV-2 viral copies this assay can detect is 138 copies/mL. A negative result does not preclude SARS-Cov-2 infection and should not be used as the sole basis for treatment or other patient management decisions. A negative result may occur with  improper specimen collection/handling, submission of specimen other than nasopharyngeal swab, presence of viral mutation(s) within the areas targeted by this assay, and inadequate number of viral copies(<138 copies/mL). A negative result must be combined with clinical observations, patient history,  and epidemiological information. The expected result is Negative.  Fact Sheet for Patients:  BloggerCourse.com  Fact Sheet for Healthcare Providers:  SeriousBroker.it  This test is no t yet approved or cleared by the [place] FDA and  has been authorized for detection and/or diagnosis of SARS-CoV-2 by FDA under an Emergency Use Authorization (EUA). This EUA will remain  in effect (meaning this test can be used) for the duration of the COVID-19 declaration under Section 564(b)(1) of the Act, 21 U.S.C.section 360bbb-3(b)(1), unless the authorization is terminated  or revoked sooner.       Influenza A by PCR NEGATIVE NEGATIVE Final   Influenza B by PCR NEGATIVE NEGATIVE Final    Comment: (NOTE) The Xpert Xpress SARS-CoV-2/FLU/RSV plus assay is intended as an aid in the diagnosis of influenza from Nasopharyngeal swab specimens and should not be used as a sole basis for treatment. Nasal washings and aspirates are unacceptable for Xpert Xpress SARS-CoV-2/FLU/RSV testing.  Fact Sheet for Patients: BloggerCourse.com  Fact Sheet for Healthcare Providers: SeriousBroker.it  This test is not yet approved or cleared by the [place] FDA and has been authorized for detection and/or diagnosis of SARS-CoV-2 by FDA under  an Emergency Use Authorization (EUA). This EUA will remain in effect (meaning this test can be used) for the duration of the COVID-19 declaration under Section 564(b)(1) of the Act, 21 U.S.C. section 360bbb-3(b)(1), unless the authorization is terminated or revoked.  Performed at [facility], [address] [place], [place] [address]      Time coordinating discharge: Over 30 minutes  SIGNED:   Tresa Moore, MD  [facility] 10/12/2021, 1:04 PM Pager   If 7PM-7AM, please contact night-coverage

## 2021-10-12 NOTE — TOC Transition Note (Signed)
Transition of Care [facility]) - CM/SW Discharge Note   Patient Details  Name: Cody Rojas MRN: [id number] Date of Birth: 11-22-1975  [facility] CM/SW Contact:  Candie Chroman, LCSW Phone Number: 10/12/2021, 9:53 AM   Clinical Narrative:   Patient has orders to discharge home today. CSW met with patient, introduced role and explained that discharge planning would be discussed. He confirmed he does not have a PCP. Gave packet for free/low-cost healthcare in [facility] [place] and intake paperwork for [facility]. Patient will pick up his prescription at [facility] when it is ready. Called and gave them his phone number. Patient declined SA resources. He confirmed he will have a ride home. No further concerns. CSW signing off.  Final next level of care: Home/Self Care Barriers to Discharge: No Barriers Identified   Patient Goals and CMS Choice        Discharge Placement                    Patient and family notified of of transfer: 10/12/21  Discharge Plan and Services                                     Social Determinants of Health (SDOH) Interventions     Readmission Risk Interventions No flowsheet data found.

## 2021-10-16 LAB — CULTURE, BLOOD (ROUTINE X 2)
Culture: NO GROWTH
Culture: NO GROWTH
Special Requests: ADEQUATE
Special Requests: ADEQUATE

## 2022-03-09 ENCOUNTER — Other Ambulatory Visit: Payer: Self-pay

## 2022-03-09 ENCOUNTER — Encounter: Payer: Self-pay | Admitting: Intensive Care

## 2022-03-09 ENCOUNTER — Emergency Department: Payer: Self-pay

## 2022-03-09 ENCOUNTER — Emergency Department
Admission: EM | Admit: 2022-03-09 | Discharge: 2022-03-09 | Disposition: A | Discharge status: Home / Self Care | Payer: Self-pay | Attending: Emergency Medicine | Admitting: Emergency Medicine

## 2022-03-09 DIAGNOSIS — X58XXXA Exposure to other specified factors, initial encounter: Secondary | ICD-10-CM | POA: Insufficient documentation

## 2022-03-09 DIAGNOSIS — M79672 Pain in left foot: Secondary | ICD-10-CM | POA: Insufficient documentation

## 2022-03-09 DIAGNOSIS — S92902A Unspecified fracture of left foot, initial encounter for closed fracture: Secondary | ICD-10-CM | POA: Insufficient documentation

## 2022-03-09 DIAGNOSIS — E119 Type 2 diabetes mellitus without complications: Secondary | ICD-10-CM | POA: Insufficient documentation

## 2022-03-09 LAB — CBC WITH DIFFERENTIAL/PLATELET
Abs Immature Granulocytes: 0.03 10*3/uL (ref 0.00–0.07)
Basophils Absolute: 0 10*3/uL (ref 0.0–0.1)
Basophils Relative: 0 %
Eosinophils Absolute: 0.1 10*3/uL (ref 0.0–0.5)
Eosinophils Relative: 1 %
HCT: 43.2 % (ref 39.0–52.0)
Hemoglobin: 14.1 g/dL (ref 13.0–17.0)
Immature Granulocytes: 0 %
Lymphocytes Relative: 13 %
Lymphs Abs: 1.3 10*3/uL (ref 0.7–4.0)
MCH: 30.1 pg (ref 26.0–34.0)
MCHC: 32.6 g/dL (ref 30.0–36.0)
MCV: 92.3 fL (ref 80.0–100.0)
Monocytes Absolute: 0.5 10*3/uL (ref 0.1–1.0)
Monocytes Relative: 5 %
Neutro Abs: 8 10*3/uL — ABNORMAL HIGH (ref 1.7–7.7)
Neutrophils Relative %: 81 %
Platelets: 280 10*3/uL (ref 150–400)
RBC: 4.68 MIL/uL (ref 4.22–5.81)
RDW: 13.7 % (ref 11.5–15.5)
WBC: 9.9 10*3/uL (ref 4.0–10.5)
nRBC: 0 % (ref 0.0–0.2)

## 2022-03-09 LAB — COMPREHENSIVE METABOLIC PANEL
ALT: 20 U/L (ref 0–44)
AST: 19 U/L (ref 15–41)
Albumin: 3.8 g/dL (ref 3.5–5.0)
Alkaline Phosphatase: 102 U/L (ref 38–126)
Anion gap: 11 (ref 5–15)
BUN: 13 mg/dL (ref 6–20)
CO2: 21 mmol/L — ABNORMAL LOW (ref 22–32)
Calcium: 8.7 mg/dL — ABNORMAL LOW (ref 8.9–10.3)
Chloride: 107 mmol/L (ref 98–111)
Creatinine, Ser: 0.96 mg/dL (ref 0.61–1.24)
GFR, Estimated: >60 mL/min (ref >60)
Glucose, Bld: 110 mg/dL — ABNORMAL HIGH (ref 70–99)
Potassium: 3.5 mmol/L (ref 3.5–5.1)
Sodium: 139 mmol/L (ref 135–145)
Total Bilirubin: 0.5 mg/dL (ref 0.3–1.2)
Total Protein: 6.9 g/dL (ref 6.5–8.1)

## 2022-03-09 LAB — URINE DRUG SCREEN, QUALITATIVE (ARMC ONLY)
Amphetamines, Ur Screen: NOT DETECTED
Barbiturates, Ur Screen: NOT DETECTED
Benzodiazepine, Ur Scrn: NOT DETECTED
Cannabinoid 50 Ng, Ur ~~LOC~~: POSITIVE — AB
Cocaine Metabolite,Ur ~~LOC~~: POSITIVE — AB
MDMA (Ecstasy)Ur Screen: NOT DETECTED
Methadone Scn, Ur: NOT DETECTED
Opiate, Ur Screen: NOT DETECTED
Phencyclidine (PCP) Ur S: NOT DETECTED
Tricyclic, Ur Screen: NOT DETECTED

## 2022-03-09 LAB — ETHANOL: Alcohol, Ethyl (B): 15 mg/dL — ABNORMAL HIGH (ref <10)

## 2022-03-09 MED ORDER — SODIUM CHLORIDE 0.9 % IV BOLUS
1000.0000 mL | Freq: Once | INTRAVENOUS | Status: AC
  Administered 2022-03-09: 1000 mL via INTRAVENOUS

## 2022-03-09 NOTE — ED Provider Notes (Signed)
[facility] Provider Note    Event Date/Time   First MD Initiated Contact with Patient 03/09/22 1011     (approximate)   History   Foot Injury   HPI  Cody Rojas is a 46 y.o. male   presents to the ED with complaint of left foot pain.  Patient gives a history in triage of an injury to his foot approximately 3 weeks ago.  Patient was talkative and answering questions appropriately in triage.  He states that in the last couple of days his foot has gotten more swollen than it did 3 weeks ago.  Patient has history of diabetes hand polysubstance abuse.      Physical Exam   Triage Vital Signs: ED Triage Vitals  Enc Vitals Group     BP 03/09/22 0958 132/84     Pulse Rate 03/09/22 0958 100     Resp 03/09/22 0958 18     Temp 03/09/22 0958 98.7 F (37.1 C)     Temp Source 03/09/22 0958 Oral     SpO2 03/09/22 0958 96 %     Weight 03/09/22 0959 242 lb (109.8 kg)     Height 03/09/22 0959 6\' 2"  (1.88 m)     Head Circumference --      Peak Flow --      Pain Score 03/09/22 0959 10     Pain Loc --      Pain Edu? --      Excl. in GC? --     Most recent vital signs: Vitals:   03/09/22 1329 03/09/22 1500  BP: 128/80 132/78  Pulse: 88 87  Resp: 18 17  Temp:  98.4 F (36.9 C)  SpO2: 97% 98%     General: Awake, no distress.  Patient was sleeping during exam but was aroused by stimuli and calling his name.  Patient only answered simple yes or no questions. CV:  Good peripheral perfusion.  Resp:  Normal effort.  Abd:  No distention.  Other:  Left foot with moderate soft tissue edema present.  Skin is intact.  No erythema or warmth present.  Motor or sensory function intact distally.  No point tenderness on palpation of the ankle and more centered over the third fourth and fifth metatarsals.   ED Results / Procedures / Treatments   Labs (all labs ordered are listed, but only abnormal results are displayed) Labs Reviewed  CBC WITH  DIFFERENTIAL/PLATELET - Abnormal; Notable for the following components:      Result Value   Neutro Abs 8.0 (*)    All other components within normal limits  COMPREHENSIVE METABOLIC PANEL - Abnormal; Notable for the following components:   CO2 21 (*)    Glucose, Bld 110 (*)    Calcium 8.7 (*)    All other components within normal limits  ETHANOL - Abnormal; Notable for the following components:   Alcohol, Ethyl (B) 15 (*)    All other components within normal limits  URINE DRUG SCREEN, QUALITATIVE ([facility] ONLY) - Abnormal; Notable for the following components:   Cocaine Metabolite,Ur [place] POSITIVE (*)    Cannabinoid 50 Ng, Ur [place] POSITIVE (*)    All other components within normal limits      RADIOLOGY  X-ray of the left foot shows changes in the second through fifth metatarsals, images were reviewed by myself and was uncertain if this was acute versus chronic.  Radiology report also mentions subacute versus chronic changes and suggest a CT  scan to further evaluate.  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   PROCEDURES:  Critical Care performed:   Procedures   MEDICATIONS ORDERED IN ED: Medications  sodium chloride 0.9 % bolus 1,000 mL (0 mLs Intravenous Stopped 03/09/22 1338)     IMPRESSION / MDM / ASSESSMENT AND PLAN / ED COURSE  I reviewed the triage vital signs and the nursing notes.   Differential diagnosis includes, but is not limited to, sprain left foot, fracture left foot, contusion left foot, acute versus chronic as patient states this occurred 3 weeks ago.  46 year old male presents to the ED with complaint of left foot pain after an injury that occurred 3 weeks ago.  Patient has continued to ambulate on his left foot despite swelling and pain.  This is the first time patient has been evaluated for his injury.  Initial x-rays suggest chronic versus acute changes in the metatarsals.  CT scan shows a subacute Lisfranc injury.  I  consulted Dr. Logan Bores who is on-call for [facility] about this patient.  He agreed with plan of splinting and placing patient on a walker.  I explained to patient that he has continued to sleep during his ED stay and that despite his answers of no alcohol or drug use he was positive for cannabis, cocaine and alcohol.  Patient has a history of polysubstance abuse.  He was made aware that pain medication would not be sent to a pharmacy due to his polysubstance abuse which in itself may cause him to fall again.  He was instructed not to weight-bear and use the walker provided for him.  Patient is aware that he is to call [facility] and this information was given to him on his discharge papers.  He was placed in a OCL splint with instructions to ice and elevate over the weekend.  Patient was noted to be standing on his left foot and walking about the room prior to discharge.      Patient's presentation is most consistent with acute illness / injury with system symptoms.  FINAL CLINICAL IMPRESSION(S) / ED DIAGNOSES   Final diagnoses:  Foot fracture, left, closed, initial encounter     Rx / DC Orders   ED Discharge Orders     None        Note:  This document was prepared using Dragon voice recognition software and may include unintentional dictation errors.   Tommi Rumps, PA-C 03/09/22 1502    Minna Antis, MD 03/09/22 [phone]

## 2022-03-09 NOTE — ED Triage Notes (Signed)
Patient presents with left foot swelling X3 weeks. Reports he thinks he broke it three weeks ago.

## 2022-03-09 NOTE — ED Notes (Signed)
See triage note  Presents with left foot pain  He states he hurt his foot about 3 weeks ago  Foot painful to ambulate  and swollen  Pt is very sleepy,snoring  but will mumble answers and then goes back to sleep

## 2022-03-09 NOTE — ED Notes (Signed)
Pt gave verbal consent to DC. Pt working on ride or taxi home.

## 2022-03-09 NOTE — Discharge Instructions (Addendum)
Call [facility] on Monday to make an appointment.  Dr. Logan Bores is the podiatrist on-call and is aware that you will be calling to make an appointment.  Use your walker to avoid putting weight on your foot.  Ice and elevation over the weekend.  You may take over-the-counter pain medication as needed for your pain.  Avoid recreational drugs and alcohol as this may increase her chances for falling and causing more damage to your foot.

## 2022-03-09 NOTE — ED Notes (Signed)
Pt given urinal and knows need for ua  ?

## 2022-05-25 ENCOUNTER — Emergency Department: Payer: Self-pay

## 2022-05-25 ENCOUNTER — Encounter: Payer: Self-pay | Admitting: Emergency Medicine

## 2022-05-25 ENCOUNTER — Emergency Department
Admission: EM | Admit: 2022-05-25 | Discharge: 2022-05-25 | Disposition: A | Discharge status: Home / Self Care | Payer: Self-pay | Attending: Emergency Medicine | Admitting: Emergency Medicine

## 2022-05-25 DIAGNOSIS — W231XXA Caught, crushed, jammed, or pinched between stationary objects, initial encounter: Secondary | ICD-10-CM | POA: Insufficient documentation

## 2022-05-25 DIAGNOSIS — S93325A Dislocation of tarsometatarsal joint of left foot, initial encounter: Secondary | ICD-10-CM | POA: Insufficient documentation

## 2022-05-25 DIAGNOSIS — R202 Paresthesia of skin: Secondary | ICD-10-CM | POA: Insufficient documentation

## 2022-05-25 DIAGNOSIS — S92902G Unspecified fracture of left foot, subsequent encounter for fracture with delayed healing: Secondary | ICD-10-CM

## 2022-05-25 DIAGNOSIS — M5410 Radiculopathy, site unspecified: Secondary | ICD-10-CM | POA: Insufficient documentation

## 2022-05-25 DIAGNOSIS — S92212G Displaced fracture of cuboid bone of left foot, subsequent encounter for fracture with delayed healing: Secondary | ICD-10-CM | POA: Insufficient documentation

## 2022-05-25 DIAGNOSIS — M79672 Pain in left foot: Secondary | ICD-10-CM | POA: Insufficient documentation

## 2022-05-25 NOTE — ED Provider Notes (Signed)
[facility] Provider Note    Event Date/Time   First MD Initiated Contact with Patient 05/25/22 [phone]     (approximate)   History   Medical Clearance   HPI  Cody Rojas is a 46 y.o. male with history of polysubstance abuse and prior left foot injury.  He presents in police custody tonight for evaluation of pain in his left foot.  He is also now reporting that he has numbness in his left arm, particularly in the thumb and first finger of his left hand.  Circumstances are unclear.  Reportedly he was chased by police and tased multiple times.  He claims that multiple officers landed on him and including on his foot.  He is not reporting any headache or neck pain, but states that he has numbness in his left arm and that it "does not feel right".  He denies shortness of breath and chest pain and abdominal pain.  He said that he has significant swelling and pain in his left foot.  He is denying pain anywhere else at this time.     Physical Exam   Triage Vital Signs: ED Triage Vitals  Enc Vitals Group     BP 05/25/22 0450 (!) 142/93     Pulse Rate 05/25/22 0450 100     Resp 05/25/22 0450 16     Temp 05/25/22 0450 98.5 F (36.9 C)     Temp Source 05/25/22 0450 Oral     SpO2 05/25/22 0450 100 %     Weight 05/25/22 0449 108.9 kg (240 lb)     Height 05/25/22 0449 1.829 m (6')     Head Circumference --      Peak Flow --      Pain Score 05/25/22 0451 10     Pain Loc --      Pain Edu? --      Excl. in GC? --     Most recent vital signs: Vitals:   05/25/22 0450  BP: (!) 142/93  Pulse: 100  Resp: 16  Temp: 98.5 F (36.9 C)  SpO2: 100%     General: Awake, no distress.  CV:  Good peripheral perfusion.  Resp:  Normal effort.  Abd:  No distention.  No tenderness to palpation. Other:  Patient has obvious swelling of the left foot with some chronic deformity.  Tender, but no evidence of bleeding and no evidence of infection.   ED Results /  Procedures / Treatments   Labs (all labs ordered are listed, but only abnormal results are displayed) Labs Reviewed - No data to display   RADIOLOGY I viewed and interpreted the patient's left foot x-rays.  He has obvious deformities but I could not determine whether or not these were acute or chronic.  However the radiology report clearly indicates that these represent chronic Lisfranc fracture/dislocations that were present as far back as 2-1/2 months ago when he presented to the emergency department.  There is no evidence of acute fracture.    PROCEDURES:  Critical Care performed: No  Procedures   MEDICATIONS ORDERED IN ED: Medications - No data to display   IMPRESSION / MDM / ASSESSMENT AND PLAN / ED COURSE  I reviewed the triage vital signs and the nursing notes.                              Differential diagnosis includes, but is not limited to, fracture,  dislocation, cervical spine injury, cervical radiculopathy, left upper extremity nerve impingement (possibly radial nerve), electrolyte or metabolic abnormality.  Patient's presentation is most consistent with acute complicated illness / injury requiring diagnostic workup.  X-rays ordered of the patient's left foot.  As documented above, he has chronic deformities the date back to at least March 09, 2022.  I reviewed the ED note and at that time, he reported that he had injured his foot 3 weeks prior.  This makes the initial injury to his foot at least 40 months old.  At that time the provider placed him in a splint and gave him all the information he needed to follow-up with podiatry, who she consulted by phone.  The patient confirmed that he has not followed up with podiatry and that he has been walking on his foot ever since.  The patient has no chest pain, shortness of breath, nausea, vomiting, abdominal pain, headache, nor neck pain.  However he is reporting some paresthesia in his left upper extremity, most notable in the  thumb and left index finger.  He has no tenderness to palpation of the cervical spine and no step-offs nor deformities palpable.  However, given the confusing and unclear but apparently violent circumstances behind his abduction, it is possible that he could have a cervical spine injury.  He is moving his shoulder without any apparent difficulty and I am not concerned about shoulder rupture or dislocation.  However I will begin by ordering CT head and CT cervical spine to rule out acute intracranial and acute bony cervical spine injury.  If these are negative, I will reassess to determine the need for possible cervical spine MRI, though I will hold off until after I reassess him in after we get the initial CT scans.  There is nothing to do in the acute setting for his foot.  At this point splinting is unlikely to be beneficial and given the degree of swelling it would only cause more problems.  I will provide a hard soled shoe.  I will reassess the patient after the CT scans.  If he continues to have no motor deficits, and a reassuring CT scan, I doubt he would benefit from MRI, but I will consider it upon reassessment.    Clinical Course as of 05/25/22 0730  Fri May 25, 2022  [id number] I viewed and interpreted the patient's head CT and cervical spine CT.  I cannot see any evidence of acute fracture, subluxation, or intracranial bleed.  I reviewed the radiologist reports and they also did not identify any evidence of acute or traumatic injury, and even specifically commented on no evidence of cervical edema which would suggest an occult injury.  I reassessed the patient.  He is still reporting some numbness and fairly clearly identifies a radial nerve distribution down the left arm and involving the first 2-3 digits of the left hand.  He has no appreciable motor deficits and no palpable deformities of the left upper extremity, the left clavicle, and has no range of motion limitations.  No indication for  further imaging.  I explained that it is likely he had a slight injury as a result of the fall he sustained during the police chase, but that his radiculopathy should improve over time.  I am providing information for him to follow-up with orthopedics when he has the opportunity.  Similarly, I stressed to him the importance of following up with podiatry and I provided the information for him to contact Dr.  Evans as an outpatient.  I offered crutches but he declined.  I provided a hard soled shoe for comfort and encouraged him to only weight-bear as tolerated and to try to minimize the pressure on the foot.  The police officers were present and heard me stressed to him the importance of outpatient follow-up.  He says that he understands and agrees with the plan. [CF]    Clinical Course User Index [CF] Loleta Rose, MD     FINAL CLINICAL IMPRESSION(S) / ED DIAGNOSES   Final diagnoses:  Lisfranc dislocation, left, initial encounter  Foot fracture, left, with delayed healing, subsequent encounter  Radiculopathy affecting upper extremity     Rx / DC Orders   ED Discharge Orders     None        Note:  This document was prepared using Dragon voice recognition software and may include unintentional dictation errors.   Loleta Rose, MD 05/25/22 0730

## 2022-05-25 NOTE — Discharge Instructions (Signed)
As we discussed, your original foot injury occurred back in June, and you need to follow-up with a foot specialist (podiatry).  We provided the information about the podiatrist with whom the emergency department provider discussed your case back in July when you first came in.  You need to try to minimize your weightbearing on the left foot and use the hard soled shoe provided.  You can use over-the-counter ibuprofen and Tylenol as needed.  Similarly, you likely sustained a temporary injury to one of the nerves in your left upper arm, most likely the radial nerve, which is explaining the numbness that you are feeling down your arm and into your hand.  When you have the opportunity, please follow-up with orthopedic surgery such as Dr. Sharlet Salina or one of his colleagues.  However your CT scans were reassuring and do not show any sign of any acute injury or fracture.  Please follow-up as an outpatient with orthopedic surgery.

## 2022-05-25 NOTE — ED Triage Notes (Signed)
Pt presents in [employer] custody for medical clearance. Pt states he was "going to jail and got caught up" and injured his left food - notable swelling to his left foot. No other complaints at this time. Denies CP or SOB.
